# Patient Record
Sex: Female | Born: 1970 | Race: Black or African American | Hispanic: No | Marital: Married | Smoking: Never smoker
Health system: Southern US, Community
[De-identification: ages and names within clinical notes are randomized; demographics above are authoritative.]

## PROBLEM LIST (undated history)

## (undated) DIAGNOSIS — D649 Anemia, unspecified: Secondary | ICD-10-CM

## (undated) DIAGNOSIS — M76899 Other specified enthesopathies of unspecified lower limb, excluding foot: Secondary | ICD-10-CM

## (undated) DIAGNOSIS — M25531 Pain in right wrist: Secondary | ICD-10-CM

## (undated) DIAGNOSIS — G935 Compression of brain: Secondary | ICD-10-CM

## (undated) DIAGNOSIS — M519 Unspecified thoracic, thoracolumbar and lumbosacral intervertebral disc disorder: Secondary | ICD-10-CM

## (undated) DIAGNOSIS — M961 Postlaminectomy syndrome, not elsewhere classified: Secondary | ICD-10-CM

## (undated) DIAGNOSIS — R000 Tachycardia, unspecified: Secondary | ICD-10-CM

## (undated) DIAGNOSIS — G894 Chronic pain syndrome: Secondary | ICD-10-CM

## (undated) DIAGNOSIS — M5106 Intervertebral disc disorders with myelopathy, lumbar region: Secondary | ICD-10-CM

## (undated) DIAGNOSIS — Z8639 Personal history of other endocrine, nutritional and metabolic disease: Secondary | ICD-10-CM

## (undated) DIAGNOSIS — M25569 Pain in unspecified knee: Secondary | ICD-10-CM

## (undated) DIAGNOSIS — R011 Cardiac murmur, unspecified: Secondary | ICD-10-CM

## (undated) HISTORY — DX: Chronic pain syndrome: G89.4

## (undated) HISTORY — DX: Anemia, unspecified: D64.9

## (undated) HISTORY — DX: Tachycardia, unspecified: R00.0

## (undated) HISTORY — PX: CHOLECYSTECTOMY: SHX55

## (undated) HISTORY — DX: Intervertebral disc disorders with myelopathy, lumbar region: M51.06

## (undated) HISTORY — DX: Unspecified thoracic, thoracolumbar and lumbosacral intervertebral disc disorder: M51.9

## (undated) HISTORY — PX: BARIATRIC SURGERY: SHX1103

## (undated) HISTORY — DX: Pain in unspecified knee: M25.569

## (undated) HISTORY — DX: Other specified enthesopathies of unspecified lower limb, excluding foot: M76.899

## (undated) HISTORY — DX: Cardiac murmur, unspecified: R01.1

## (undated) HISTORY — DX: Compression of brain: G93.5

## (undated) HISTORY — DX: Pain in right wrist: M25.531

## (undated) HISTORY — DX: Postlaminectomy syndrome, not elsewhere classified: M96.1

---

## 2006-04-06 ENCOUNTER — Ambulatory Visit: Payer: Self-pay | Admitting: Internal Medicine

## 2007-06-23 ENCOUNTER — Encounter: Payer: Self-pay | Admitting: Internal Medicine

## 2007-06-23 DIAGNOSIS — J309 Allergic rhinitis, unspecified: Secondary | ICD-10-CM | POA: Insufficient documentation

## 2007-06-23 DIAGNOSIS — D649 Anemia, unspecified: Secondary | ICD-10-CM

## 2009-07-21 HISTORY — PX: BARIATRIC SURGERY: SHX1103

## 2010-12-06 NOTE — Assessment & Plan Note (Signed)
[facility]                             PRIMARY CARE OFFICE NOTE   NAME:WILLIAMSRishita, Petron                       MRN:          [id number]  DATE:04/06/2006                            DOB:          1971-01-23    CHIEF COMPLAINT:  New patient in practice/physical.   HISTORY OF PRESENT ILLNESS:  The patient is a 39 year old African-American  female who is here to establish primary care.  She has recently moved from  the [place] area.  The patient has been fairly healthy for most of her  life and has a history of mild iron deficiency anemia approximately 10 years  ago.  She has had no major illnesses or hospitalizations.  She had natural  childbirth January 28, 2006.  The patient was, however, noted to have some  issues with gestational diabetes.   The patient also has a strong family history with father deceased at age 45,  secondary to complications of diabetes and chronic renal failure; and mother  at age 80 is currently living and is also noted to be diabetic.   CURRENT MEDICATIONS:  None.   ALLERGIES TO MEDICATIONS:  SULFA which causes rash and fever.   SOCIAL HISTORY:  The patient is married, has an 40-month-old son and is a  Programmer, applications.   FAMILY HISTORY:  As noted above.  No family history of colon cancer or  breast cancer.  Some coronary artery disease on her father's side of the  family.   HABITS:  She seldom drinks.  Has no history of tobacco use or recreational  drug use.   PREVENTATIVE CARE HISTORY:  Her last Pap was in 2006.  She is currently in  the process of looking for a OB/GYN group.   PAST MEDICAL HISTORY SUMMARY:  A history of back pain with herniated disk  between L5-S1.   REVIEW OF SYSTEMS:  No fever or chills.  History of allergic rhinitis  currently quiescent.  The patient denies any chest pain, shortness of  breath, dyspnea on exertion.  Denies any heartburn, nausea, vomiting,  constipation, diarrhea,  no polyuria, polydipsia, and all other systems  negative.   PHYSICAL EXAMINATION:  VITALS:  Height 5 feet 5-1/2 inches.  Weight 223  pounds.  Temperature is 98.2.  Pulse is 92.  Blood pressure is 125/80.  GENERAL:  The patient is a very pleasant, obese, 40 year old [place]-  American female, pleasant in no apparent distress who appears her stated  age.  HEENT:  Normocephalic, atraumatic.  Pupils are equal and react to light  bilaterally.  Extraocular motility was intact.  Patient was anicteric.  Conjunctiva was within normal limits.  External auditory canal and tympanic  membranes are clear bilaterally.  Oropharyngeal exam was unremarkable.  NECK:  Supple.  No adenopathy, carotid bruit, or thyromegaly.  CHEST EXAM:  Normal expiratory effort.  Clear to auscultation bilaterally.  No rhonchi, rales or wheezing.  CARDIOVASCULAR:  Regular rate and rhythm.  No significant murmurs, rubs, or  gallops appreciated.  ABDOMEN:  Soft and protuberant, nontender.  Positive bowel sounds.  No  organomegaly.  MUSCULOSKELETAL EXAM:  No clubbing, cyanosis, or edema.  SKIN:  Warm and dry.  NEUROLOGIC:  Cranial nerves II-XII were grossly intact.  She was nonfocal.   IMPRESSION/RECOMMENDATIONS:  A routine physical of a 40 year old African-  American female with a strong family history of type 2 diabetes and elevated  blood sugars during pregnancy.   RECOMMENDATIONS:  The patient will have records from her previous physician  forwarded to [place].  Recommend a periodic check of her fasting blood sugar to  screen for type 2 diabetes.  Patient counseled on need for weight loss and  regular exercise.   We will refer the patient to local OB/GYN for Pap and pelvic.  She had a PPD  placed today as part of her job requirement, and will followup with me in 6-  8 months.                                   Barbette Hair. Artist Pais, DO   RDY/MedQ  DD:  04/06/2006  DT:  04/07/2006  Job #:  [id number]

## 2012-07-21 HISTORY — PX: CHOLECYSTECTOMY: SHX55

## 2015-12-13 ENCOUNTER — Other Ambulatory Visit: Payer: Self-pay | Admitting: Orthopedic Surgery

## 2015-12-13 DIAGNOSIS — M5441 Lumbago with sciatica, right side: Principal | ICD-10-CM

## 2015-12-13 DIAGNOSIS — G8929 Other chronic pain: Secondary | ICD-10-CM

## 2015-12-24 MED ORDER — CEFAZOLIN SODIUM-DEXTROSE 2-4 GM/100ML-% IV SOLN
2.0000 g | Freq: Once | INTRAVENOUS | Status: AC
  Administered 2015-12-25: 2 g via INTRAVENOUS

## 2015-12-24 MED ORDER — KETOROLAC TROMETHAMINE 30 MG/ML IJ SOLN
30.0000 mg | Freq: Once | INTRAMUSCULAR | Status: AC
  Administered 2015-12-25: 30 mg via INTRAVENOUS

## 2015-12-24 MED ORDER — SODIUM CHLORIDE 0.9 % IV SOLN
INTRAVENOUS | Status: DC
  Administered 2015-12-25: 08:00:00 via INTRAVENOUS

## 2015-12-24 MED ORDER — MIDAZOLAM HCL 2 MG/2ML IJ SOLN
1.0000 mg | INTRAMUSCULAR | Status: DC | PRN
  Administered 2015-12-25: 1 mg via INTRAVENOUS

## 2015-12-24 MED ORDER — FENTANYL CITRATE (PF) 100 MCG/2ML IJ SOLN
25.0000 ug | INTRAMUSCULAR | Status: DC | PRN
  Administered 2015-12-25: 25 ug via INTRAVENOUS
  Administered 2015-12-25: 50 ug via INTRAVENOUS

## 2015-12-25 ENCOUNTER — Ambulatory Visit
Admission: RE | Admit: 2015-12-25 | Discharge: 2015-12-25 | Disposition: A | Discharge status: Home / Self Care | Payer: Worker's Compensation | Source: Ambulatory Visit | Attending: Orthopedic Surgery | Admitting: Orthopedic Surgery

## 2015-12-25 ENCOUNTER — Other Ambulatory Visit: Payer: Self-pay | Admitting: Orthopedic Surgery

## 2015-12-25 ENCOUNTER — Inpatient Hospital Stay
Admission: RE | Admit: 2015-12-25 | Discharge: 2015-12-25 | Disposition: A | Discharge status: Home / Self Care | Payer: Self-pay | Source: Ambulatory Visit | Attending: Orthopedic Surgery | Admitting: Orthopedic Surgery

## 2015-12-25 VITALS — BP 128/79 | HR 85 | Temp 98.1°F | Resp 14 | Ht 65.0 in | Wt 135.0 lb

## 2015-12-25 DIAGNOSIS — M545 Low back pain: Secondary | ICD-10-CM

## 2015-12-25 DIAGNOSIS — G8929 Other chronic pain: Secondary | ICD-10-CM

## 2015-12-25 DIAGNOSIS — M5441 Lumbago with sciatica, right side: Principal | ICD-10-CM

## 2015-12-25 MED ORDER — IOPAMIDOL (ISOVUE-M 200) INJECTION 41%
8.0000 mL | Freq: Once | INTRAMUSCULAR | Status: AC
  Administered 2015-12-25: 8 mL

## 2015-12-25 NOTE — Progress Notes (Signed)
Sedation time for discogram is 37 minutes.  Donell SievertJeanne Danel Studzinski, RN

## 2015-12-25 NOTE — Discharge Instructions (Signed)
Discogram Post Procedure Discharge Instructions ° °1. May resume a regular diet and any medications that you routinely take (including pain medications). °2. No driving day of procedure. °3. Upon discharge go home and rest for at least 4 hours.  May use an ice pack as needed to injection sites on back.  Ice to back 30 minutes on and 30 minutes off, all day. °4. May remove bandades later, today. °5. It is not unusual to be sore for several days after this procedure. ° ° ° °Please contact our office at [phone] for the following symptoms: ° °· Fever greater than 100 degrees °· Increased swelling, pain, or redness at injection site. ° ° °Thank you for visiting [facility]. ° ° °

## 2016-01-29 ENCOUNTER — Ambulatory Visit: Payer: Self-pay | Admitting: Physician Assistant

## 2016-01-30 ENCOUNTER — Telehealth: Payer: Self-pay | Admitting: Vascular Surgery

## 2016-01-30 NOTE — Telephone Encounter (Signed)
-----   Message from Phillips Odorarol S Pullins, RN sent at 01/28/2016  5:41 PM EDT ----- Regarding: needs office consult with Dr. Vickey Hugerickson Contact: [phone]: [phone] Please schedule this pt. For a new pt. Consult with Dr. Edilia Boickson, prior to TDR L5-S1 scheduled on 03/06/16.  Please remind the pt. To bring her LS spine films to appt.

## 2016-01-30 NOTE — Telephone Encounter (Signed)
Sched appt 8/9 at 11. Lm on hm#.

## 2016-02-01 ENCOUNTER — Ambulatory Visit: Payer: Self-pay | Admitting: Physician Assistant

## 2016-02-07 ENCOUNTER — Other Ambulatory Visit: Payer: Self-pay

## 2016-02-08 ENCOUNTER — Other Ambulatory Visit: Payer: Self-pay

## 2016-02-11 ENCOUNTER — Telehealth: Payer: Self-pay | Admitting: Surgery

## 2016-02-11 NOTE — Telephone Encounter (Signed)
-----   Message from Phillips Odor, RN sent at 02/08/2016  2:06 PM EDT ----- Regarding: change new patient consult to Dr. Myra Gianotti Due to change in surgery date and MD assisting, we need to switch this pt. From 8/9 CSD appt. to one with Dr. Myra Gianotti.  The surgery has been rescheduled to 03/13/16.  Can we get her in to meet Dr. Myra Gianotti prior to 8/24?

## 2016-02-11 NOTE — Telephone Encounter (Signed)
Sched appt 8/21 at 10:00. Lm on hm# to inform pt.

## 2016-02-26 ENCOUNTER — Inpatient Hospital Stay (HOSPITAL_COMMUNITY)
Admission: RE | Admit: 2016-02-26 | Discharge: 2016-02-26 | Disposition: A | Discharge status: Home / Self Care | Payer: Self-pay | Source: Ambulatory Visit

## 2016-02-27 ENCOUNTER — Encounter: Payer: Self-pay | Admitting: Vascular Surgery

## 2016-02-29 ENCOUNTER — Encounter (HOSPITAL_COMMUNITY): Payer: Self-pay

## 2016-02-29 ENCOUNTER — Encounter (HOSPITAL_COMMUNITY)
Admission: RE | Admit: 2016-02-29 | Discharge: 2016-02-29 | Disposition: A | Discharge status: Home / Self Care | Payer: Worker's Compensation | Source: Ambulatory Visit | Attending: Orthopedic Surgery | Admitting: Orthopedic Surgery

## 2016-02-29 DIAGNOSIS — Z01818 Encounter for other preprocedural examination: Secondary | ICD-10-CM | POA: Diagnosis present

## 2016-02-29 DIAGNOSIS — D509 Iron deficiency anemia, unspecified: Secondary | ICD-10-CM | POA: Insufficient documentation

## 2016-02-29 DIAGNOSIS — Z79899 Other long term (current) drug therapy: Secondary | ICD-10-CM | POA: Insufficient documentation

## 2016-02-29 DIAGNOSIS — Z01812 Encounter for preprocedural laboratory examination: Secondary | ICD-10-CM | POA: Insufficient documentation

## 2016-02-29 DIAGNOSIS — Z9884 Bariatric surgery status: Secondary | ICD-10-CM | POA: Diagnosis not present

## 2016-02-29 HISTORY — DX: Personal history of other endocrine, nutritional and metabolic disease: Z86.39

## 2016-02-29 LAB — BASIC METABOLIC PANEL
ANION GAP: 8 (ref 5–15)
BUN: 9 mg/dL (ref 6–20)
CALCIUM: 9 mg/dL (ref 8.9–10.3)
CO2: 22 mmol/L (ref 22–32)
Chloride: 107 mmol/L (ref 101–111)
Creatinine, Ser: 0.69 mg/dL (ref 0.44–1.00)
GFR calc Af Amer: >60 mL/min (ref >60)
GFR calc non Af Amer: >60 mL/min (ref >60)
GLUCOSE: 79 mg/dL (ref 65–99)
POTASSIUM: 4 mmol/L (ref 3.5–5.1)
Sodium: 137 mmol/L (ref 135–145)

## 2016-02-29 LAB — HCG, SERUM, QUALITATIVE: Preg, Serum: NEGATIVE

## 2016-02-29 LAB — CBC
HEMATOCRIT: 24.3 % — AB (ref 36.0–46.0)
Hemoglobin: 6.7 g/dL — CL (ref 12.0–15.0)
MCH: 18.3 pg — AB (ref 26.0–34.0)
MCHC: 27.6 g/dL — ABNORMAL LOW (ref 30.0–36.0)
MCV: 66.2 fL — AB (ref 78.0–100.0)
Platelets: 288 10*3/uL (ref 150–400)
RBC: 3.67 MIL/uL — ABNORMAL LOW (ref 3.87–5.11)
RDW: 19.7 % — AB (ref 11.5–15.5)
WBC: 4.8 10*3/uL (ref 4.0–10.5)

## 2016-02-29 LAB — SURGICAL PCR SCREEN
MRSA, PCR: NEGATIVE
STAPHYLOCOCCUS AUREUS: NEGATIVE

## 2016-02-29 NOTE — Pre-Procedure Instructions (Signed)
Karla Duran  02/29/2016      [id number] - [place], [place] - [address] AT [facility] [address] [place] [address] Phone: [phone] Fax: [phone]    Your procedure is scheduled on Thursday, August 24th, 2017.  Report to [facility] Admitting at 5:30 A.M.   Call this number if you have problems the morning of surgery:  [phone]   Remember:  Do not eat food or drink liquids after midnight.   Take these medicines the morning of surgery with A SIP OF WATER: Nasal spray if needed.    7 days prior to surgery, stop taking: Aspirin, NSAIDS, Aleve, Naproxen, Ibuprofen, Advil, Motrin, BC's, Goody's, Fish oil, all herbal medications, and all vitamins.    Do not wear jewelry, make-up or nail polish.  Do not wear lotions, powders, or perfumes.  You may NOT wear deoderant.  Do not shave 48 hours prior to surgery.    Do not bring valuables to the hospital.  [facility] is not responsible for any belongings or valuables.  Contacts, dentures or bridgework may not be worn into surgery.  Leave your suitcase in the car.  After surgery it may be brought to your room.  For patients admitted to the hospital, discharge time will be determined by your treatment team.  Patients discharged the day of surgery will not be allowed to drive home.   Special instructions:  Preparing for Surgery.   Please read over the following fact sheets that you were given. MRSA Information    [place]- Preparing For Surgery  Before surgery, you can play an important role. Because skin is not sterile, your skin needs to be as free of germs as possible. You can reduce the number of germs on your skin by washing with CHG (chlorahexidine gluconate) Soap before surgery.  CHG is an antiseptic cleaner which kills germs and bonds with the skin to continue killing germs even after washing.  Please do not use if you have an allergy to  CHG or antibacterial soaps. If your skin becomes reddened/irritated stop using the CHG.  Do not shave (including legs and underarms) for at least 48 hours prior to first CHG shower. It is OK to shave your face.  Please follow these instructions carefully.   1. Shower the NIGHT BEFORE SURGERY and the MORNING OF SURGERY with CHG.   2. If you chose to wash your hair, wash your hair first as usual with your normal shampoo.  3. After you shampoo, rinse your hair and body thoroughly to remove the shampoo.  4. Use CHG as you would any other liquid soap. You can apply CHG directly to the skin and wash gently with a scrungie or a clean washcloth.   5. Apply the CHG Soap to your body ONLY FROM THE NECK DOWN.  Do not use on open wounds or open sores. Avoid contact with your eyes, ears, mouth and genitals (private parts). Wash genitals (private parts) with your normal soap.  6. Wash thoroughly, paying special attention to the area where your surgery will be performed.  7. Thoroughly rinse your body with warm water from the neck down.  8. DO NOT shower/wash with your normal soap after using and rinsing off the CHG Soap.  9. Pat yourself dry with a CLEAN TOWEL.   10. Wear CLEAN PAJAMAS   11. Place CLEAN SHEETS on your bed the night of your  first shower and DO NOT SLEEP WITH PETS.  Day of Surgery: Do not apply any deodorants/lotions. Please wear clean clothes to the hospital/surgery center.

## 2016-02-29 NOTE — Progress Notes (Signed)
PCP - [facility] if needed Cardiologist - denies  EKG/CXR - denies Echo/Stress test/Cardiac Cath - denies  Patient denies chest pain and shortness of breath at PAT appointment.

## 2016-02-29 NOTE — Progress Notes (Signed)
CRITICAL VALUE ALERT  Critical value received:  HgB 6.7  Date of notification:  02/29/2016  Time of notification:  1619  Critical value read back:Yes.    Nurse who received alert:  Cyndia DiverLindsi Jamaris Theard, RN  MD notified (1st page):  Dr. Shon BatonBrooks' office - Madlyn FrankelSherry Willis  Time of first page:  [phone]  Cordelia PenSherry states that she is paging Dr. Shon BatonBrooks to let him know of results and that she will contact the patient.

## 2016-03-04 ENCOUNTER — Encounter: Payer: Self-pay | Admitting: Surgery

## 2016-03-04 NOTE — Progress Notes (Addendum)
Anesthesia Chart Review: Patient is a 45 year old female scheduled for total disc replacement L5-S1 (Dr. Shon BatonBrooks) with abdominal exposure (Dr. Edilia Boickson) on 03/13/16.  History includes iron deficiency anemia, cholecystectomy, bariatric surgery (laparoscopic Roux-en-Y gastric bypass) [facility]. Non-smoker.   She [facility].  Meds include ibuprofen, Afrin nasal spray, Zanaflex, tramadol.  BP 137/79   Pulse 100   Temp 37 C   Resp 20   Ht 5' 5.5" (1.664 m)   Wt 127 lb (57.6 kg)   LMP 02/05/2016 (Approximate)   SpO2 100%   BMI 20.81 kg/m   Preoperative labs showed a normal BMET but severe anemia with H/H 6.7/24.3, MCV 66.2. Serum pregnancy negative. LMP 02/05/16. Dr. Shon BatonBrooks' office notified of H/H 02/29/16 and staff attempted to reach patient on multiple occasions and also leaving voice messages. Just this morning Dr. Shon BatonBrooks was finally able to talk with patient to review her results. She reportedly has a family member in the hospital (I believe it is her husband), and admittedly has not been taking care as good of care of her self (ie, poor intake. She is also on ibuprofen, but unclear how much she is actually taking.).  She is going to seek medical evaluation with labs within the next 24 hours. Dr. Shon BatonBrooks would make a final decision regarding timing of surgery once he had repeat results and recommendations from her medical provider. Chart will be left for follow-up.  Velna Ochsllison Zelenak, PA-C [facility]/Anesthesiology Phone [phone]) [phone] 03/04/2016 12:51 PM  Addendum: Patient had labs redrawn at [facility] on 03/07/16. Results came back today showing an H/H 7.6/26.8, MCV 65.9. Dr. Shon BatonBrooks called and spoke with Dr. Knox RoyaltyEnrico Jones. He is going to see the patient, and based on his recommendations Dr. Shon BatonBrooks will consider whether or not to proceed as planned or postpone. I have updated anesthesiologist Dr. Aleene DavidsonE.  Fitzgerald. We will await Dr. Yetta BarreJones' recommendations, but if surgery on 03/13/16 is still desired may need to consider admission prior to surgery for transfusion.  Sherry at Dr. Shon BatonBrooks' office will call with an update once available.  Velna Ochsllison Zelenak, PA-C [facility]/Anesthesiology Phone [phone]) [phone] 03/10/2016 5:11 PM  Addendum: I spoke with Cordelia PenSherry on the morning on 03/11/16 about Dr. Jarrett AblesFitzgerald's recommendations. She called me back late yesterday. She reports that Dr. Shon BatonBrooks spoke with Dr. Yetta BarreJones again in the afternoon, and that Dr. Yetta BarreJones is clearing patient for surgery since she has known anemia and because cell saver is being used intra-operatively. To Sherry's understanding, Dr. Shon BatonBrooks was not planning to transfuse patient prior to her arrival for surgery. I discussed with anesthesiologist Dr. Chaney MallingHodierne. He recommended that 1) PRBC be made available to be given before or during surgery as indicated. (I ordered T&S and prepare 3 Units PRBC to be drawn on arrival. Defer transfusion order to surgeon and/or anesthesiologist.) 2) Notify patient that she will likely require a blood transfusion and discuss consent. I called patient this morning, and informed her that it is likely that she will require a blood transfusion perioperatively. I did discuss the [facility] informed consent for transfusion of blood products form with patient. She is agreeable to a transfusion if needed. In regards to how her anemia is currently be managed, she reports that she saw Dr. Yetta BarreJones on 03/07/16 and 03/10/16 (office note requested). She is now taking iron and a multi-vitamin. Her husband is now home from the hospital, and  she has been taking better care of herself. (Reportedly, she had not eaten for three days when she came in to PAT last week due to the stress of her husband being hospitalized and wanting to be by his side.) Her LMP was 03/07/16 and was not excessively heavy.  Further evaluation by her surgeon and  anesthesiologist on arrival tomorrow. She will get STAT T&S, Prepare PRBC on arrival. Defer decision for transfusion to surgeon or anesthesiologist. I have notified today's Holding charge RN Boneta LucksJenny in hopes staff will get lab draw expedited on patient's arrival tomorrow.   Velna Ochsllison Zelenak, PA-C [facility]/Anesthesiology Phone [phone]) [phone] 03/12/2016 11:03 AM

## 2016-03-10 ENCOUNTER — Encounter: Payer: Self-pay | Admitting: Surgery

## 2016-03-10 ENCOUNTER — Ambulatory Visit (INDEPENDENT_AMBULATORY_CARE_PROVIDER_SITE_OTHER): Payer: Worker's Compensation | Admitting: Surgery

## 2016-03-10 VITALS — BP 122/80 | HR 123 | Temp 98.6°F | Resp 16 | Ht 65.0 in | Wt 128.0 lb

## 2016-03-10 DIAGNOSIS — M479 Spondylosis, unspecified: Secondary | ICD-10-CM

## 2016-03-10 NOTE — Progress Notes (Signed)
[facility]  Patient name: Karla CanterShawn P Saunders-Williams MRN: [id number] DOB: 1970/12/06 Sex: female  REFERRING PHYSICIAN: Dr. Shon BatonBrooks  REASON FOR CONSULT: Anterior exposure  HPI: Karla CanterShawn P Saunders-Williams is a 45 y.o. female, who is referred today for discussions regarding anterior exposure of the L5-S1 disc space.  The patient has been seen and evaluated by Dr. Shon BatonBrooks who has recommended disc replacement of this level.  Patient suffers from chronic back pain.  She has a history of gastric bypass done laparoscopically as well as cholecystectomy.  She is a nonsmoker.    Past Medical History:  Diagnosis Date  . History of iron deficiency     No family history on file.  SOCIAL HISTORY: Social History   Social History  . Marital status: Married    Spouse name: N/A  . Number of children: N/A  . Years of education: N/A   Occupational History  . Not on file.   Social History Main Topics  . Smoking status: Never Smoker  . Smokeless tobacco: Never Used  . Alcohol use 0.0 oz/week     Comment: occ.   . Drug use: No  . Sexual activity: Not on file   Other Topics Concern  . Not on file   Social History Narrative  . No narrative on file    Allergies  Allergen Reactions  . Sulfa Antibiotics Rash and Other (See Comments)    fever    Current Outpatient Prescriptions  Medication Sig Dispense Refill  . tiZANidine (ZANAFLEX) 4 MG tablet Take 4 mg by mouth at bedtime as needed for muscle spasms.   0  . traMADol (ULTRAM) 50 MG tablet Take 50 mg by mouth at bedtime as needed (pain).   0  . ibuprofen (ADVIL,MOTRIN) 200 MG tablet Take 600-800 mg by mouth 2 (two) times daily as needed for headache (pain).    Marland Kitchen. oxymetazoline (AFRIN) 0.05 % nasal spray Place 1 spray into both nostrils at bedtime as needed for congestion.     No current facility-administered medications for this visit.     REVIEW OF SYSTEMS:  [X]   denotes positive finding, [ ]  denotes negative finding Cardiac  Comments:  Chest pain or chest pressure:    Shortness of breath upon exertion:    Short of breath when lying flat:    Irregular heart rhythm:        Vascular    Pain in calf, thigh, or hip brought on by ambulation:    Pain in feet at night that wakes you up from your sleep:     Blood clot in your veins:    Leg swelling:         Pulmonary    Oxygen at home:    Productive cough:     Wheezing:         Neurologic    Sudden weakness in arms or legs:     Sudden numbness in arms or legs:     Sudden onset of difficulty speaking or slurred speech:    Temporary loss of vision in one eye:     Problems with dizziness:         Gastrointestinal    Blood in stool:     Vomited blood:         Genitourinary    Burning when urinating:     Blood in urine:        Psychiatric    Major depression:  Hematologic    Bleeding problems:    Problems with blood clotting too easily:        Skin    Rashes or ulcers:        Constitutional    Fever or chills:      PHYSICAL EXAM: Vitals:   03/10/16 1011  BP: 122/80  Pulse: (!) 123  Resp: 16  Temp: 98.6 F (37 C)  TempSrc: Oral  SpO2: 100%  Weight: 128 lb (58.1 kg)  Height: 5\' 5"  (1.651 m)    GENERAL: The patient is a well-nourished female, in no acute distress. The vital signs are documented above. CARDIAC: There is a regular rate and rhythm.  VASCULAR: Palpable pedal pulses PULMONARY: There is good air exchange bilaterally without wheezing or rales. ABDOMEN: Soft and non-tender with normal pitched bowel sounds.  MUSCULOSKELETAL: There are no major deformities or cyanosis. NEUROLOGIC: No focal weakness or paresthesias are detected. SKIN: There are no ulcers or rashes noted. PSYCHIATRIC: The patient has a normal affect.  DATA:  I have reviewed her CT scan.  There is no significant atherosclerotic vascular disease  MEDICAL ISSUES: Degenerative back disease: I  discussed my role in the anterior exposure of the L5-S1 disc space.  We discussed potential injury to the iliac artery and vein as well as the ureter.  We discussed postoperative ileus and wound complications.  All of her questions were answered.  Her surgery is scheduled for this Thursday, August 24   Durene CalWells Meagen Limones, MD [facility] Tel [phone]) [phone] Pager [phone](336) [phone]

## 2016-03-12 NOTE — Anesthesia Preprocedure Evaluation (Signed)
Anesthesia Evaluation  Patient identified by MRN, date of birth, ID band Patient awake    Reviewed: Allergy & Precautions, NPO status , Patient's Chart, lab work & pertinent test results  Airway Mallampati: II  TM Distance: >3 FB Neck ROM: Full    Dental no notable dental hx.    Pulmonary neg pulmonary ROS,    Pulmonary exam normal breath sounds clear to auscultation       Cardiovascular negative cardio ROS Normal cardiovascular exam Rhythm:Regular Rate:Normal     Neuro/Psych negative neurological ROS  negative psych ROS   GI/Hepatic negative GI ROS, Neg liver ROS,   Endo/Other  negative endocrine ROS  Renal/GU negative Renal ROS     Musculoskeletal negative musculoskeletal ROS (+)   Abdominal   Peds  Hematology  (+) anemia ,   Anesthesia Other Findings   Reproductive/Obstetrics negative OB ROS                            Anesthesia Physical  Anesthesia Plan  ASA: II  Anesthesia Plan: General   Post-op Pain Management:    Induction: Intravenous  Airway Management Planned: Oral ETT  Additional Equipment:   Intra-op Plan:   Post-operative Plan: Extubation in OR  Informed Consent: I have reviewed the patients History and Physical, chart, labs and discussed the procedure including the risks, benefits and alternatives for the proposed anesthesia with the patient or authorized representative who has indicated his/her understanding and acceptance.   Dental advisory given  Plan Discussed with: CRNA  Anesthesia Plan Comments:        Anesthesia Quick Evaluation  

## 2016-03-13 ENCOUNTER — Inpatient Hospital Stay (HOSPITAL_COMMUNITY)
Admission: RE | Admit: 2016-03-13 | Discharge: 2016-03-14 | DRG: 460 | Disposition: A | Discharge status: Home / Self Care | Payer: Worker's Compensation | Source: Ambulatory Visit | Attending: Orthopedic Surgery | Admitting: Orthopedic Surgery

## 2016-03-13 ENCOUNTER — Inpatient Hospital Stay (HOSPITAL_COMMUNITY): Payer: Worker's Compensation

## 2016-03-13 ENCOUNTER — Encounter (HOSPITAL_COMMUNITY): Payer: Self-pay | Admitting: Certified Registered Nurse Anesthetist

## 2016-03-13 ENCOUNTER — Inpatient Hospital Stay (HOSPITAL_COMMUNITY): Payer: Worker's Compensation | Admitting: Vascular Surgery

## 2016-03-13 ENCOUNTER — Inpatient Hospital Stay (HOSPITAL_COMMUNITY): Payer: Worker's Compensation | Admitting: Emergency Medicine

## 2016-03-13 ENCOUNTER — Encounter (HOSPITAL_COMMUNITY)
Admission: RE | Disposition: A | Discharge status: Home / Self Care | Payer: Self-pay | Source: Ambulatory Visit | Attending: Orthopedic Surgery

## 2016-03-13 DIAGNOSIS — M5410 Radiculopathy, site unspecified: Secondary | ICD-10-CM | POA: Diagnosis present

## 2016-03-13 DIAGNOSIS — Z981 Arthrodesis status: Secondary | ICD-10-CM

## 2016-03-13 DIAGNOSIS — Z9884 Bariatric surgery status: Secondary | ICD-10-CM | POA: Diagnosis not present

## 2016-03-13 DIAGNOSIS — Z419 Encounter for procedure for purposes other than remedying health state, unspecified: Secondary | ICD-10-CM

## 2016-03-13 DIAGNOSIS — M5137 Other intervertebral disc degeneration, lumbosacral region: Secondary | ICD-10-CM

## 2016-03-13 DIAGNOSIS — M5116 Intervertebral disc disorders with radiculopathy, lumbar region: Principal | ICD-10-CM | POA: Diagnosis present

## 2016-03-13 DIAGNOSIS — Z79899 Other long term (current) drug therapy: Secondary | ICD-10-CM

## 2016-03-13 DIAGNOSIS — M549 Dorsalgia, unspecified: Secondary | ICD-10-CM | POA: Diagnosis present

## 2016-03-13 HISTORY — PX: ABDOMINAL EXPOSURE: SHX5708

## 2016-03-13 HISTORY — PX: ANTERIOR LUMBAR DISC ARTHROPLASTY: SHX5721

## 2016-03-13 LAB — PREPARE RBC (CROSSMATCH)

## 2016-03-13 LAB — ABO/RH: ABO/RH(D): O POS

## 2016-03-13 SURGERY — ANTERIOR LUMBAR DISC ARTHROPLASTY
Anesthesia: General | Site: Back

## 2016-03-13 MED ORDER — THROMBIN 20000 UNITS EX KIT: PACK | CUTANEOUS | Status: DC | PRN

## 2016-03-13 MED ORDER — ARTIFICIAL TEARS OP OINT
TOPICAL_OINTMENT | OPHTHALMIC | Status: AC
  Filled 2016-03-13: qty 3.5

## 2016-03-13 MED ORDER — SUGAMMADEX SODIUM 200 MG/2ML IV SOLN
INTRAVENOUS | Status: AC
  Filled 2016-03-13: qty 2

## 2016-03-13 MED ORDER — CEFAZOLIN SODIUM-DEXTROSE 2-4 GM/100ML-% IV SOLN
2.0000 g | INTRAVENOUS | Status: AC
  Administered 2016-03-13: 2 g via INTRAVENOUS
  Filled 2016-03-13: qty 100

## 2016-03-13 MED ORDER — OXYCODONE-ACETAMINOPHEN 10-325 MG PO TABS: 1.0000 | ORAL_TABLET | ORAL | 0 refills | Status: DC | PRN

## 2016-03-13 MED ORDER — SUCCINYLCHOLINE CHLORIDE 200 MG/10ML IV SOSY
PREFILLED_SYRINGE | INTRAVENOUS | Status: AC
  Filled 2016-03-13: qty 10

## 2016-03-13 MED ORDER — CHLORHEXIDINE GLUCONATE 4 % EX LIQD: 60.0000 mL | Freq: Once | CUTANEOUS | Status: DC

## 2016-03-13 MED ORDER — PROPOFOL 10 MG/ML IV BOLUS
INTRAVENOUS | Status: DC | PRN
  Administered 2016-03-13: 120 mg via INTRAVENOUS
  Administered 2016-03-13: 80 mg via INTRAVENOUS

## 2016-03-13 MED ORDER — MENTHOL 3 MG MT LOZG: 1.0000 | LOZENGE | OROMUCOSAL | Status: DC | PRN

## 2016-03-13 MED ORDER — PROPOFOL 10 MG/ML IV BOLUS
INTRAVENOUS | Status: AC
  Filled 2016-03-13: qty 20

## 2016-03-13 MED ORDER — MIDAZOLAM HCL 2 MG/2ML IJ SOLN
INTRAMUSCULAR | Status: AC
  Filled 2016-03-13: qty 2

## 2016-03-13 MED ORDER — THROMBIN 20000 UNITS EX SOLR
CUTANEOUS | Status: AC
  Filled 2016-03-13: qty 20000

## 2016-03-13 MED ORDER — PHENOL 1.4 % MT LIQD
1.0000 | OROMUCOSAL | Status: DC | PRN
Start: 2016-03-13 — End: 2016-03-14

## 2016-03-13 MED ORDER — HYDROMORPHONE HCL 1 MG/ML IJ SOLN
INTRAMUSCULAR | Status: AC
  Administered 2016-03-13: 0.5 mg via INTRAVENOUS
  Filled 2016-03-13: qty 1

## 2016-03-13 MED ORDER — DEXAMETHASONE SODIUM PHOSPHATE 10 MG/ML IJ SOLN
INTRAMUSCULAR | Status: DC | PRN
  Administered 2016-03-13: 10 mg via INTRAVENOUS

## 2016-03-13 MED ORDER — 0.9 % SODIUM CHLORIDE (POUR BTL) OPTIME
TOPICAL | Status: DC | PRN
  Administered 2016-03-13 (×2): 1000 mL

## 2016-03-13 MED ORDER — PHENYLEPHRINE HCL 10 MG/ML IJ SOLN
INTRAMUSCULAR | Status: DC | PRN
  Administered 2016-03-13: 120 ug via INTRAVENOUS
  Administered 2016-03-13: 160 ug via INTRAVENOUS
  Administered 2016-03-13: 120 ug via INTRAVENOUS

## 2016-03-13 MED ORDER — LIDOCAINE 2% (20 MG/ML) 5 ML SYRINGE
INTRAMUSCULAR | Status: AC
  Filled 2016-03-13: qty 5

## 2016-03-13 MED ORDER — DEXAMETHASONE 4 MG PO TABS
4.0000 mg | ORAL_TABLET | Freq: Four times a day (QID) | ORAL | Status: AC
  Administered 2016-03-13 (×3): 4 mg via ORAL
  Filled 2016-03-13 (×3): qty 1

## 2016-03-13 MED ORDER — METHOCARBAMOL 500 MG PO TABS
500.0000 mg | ORAL_TABLET | Freq: Four times a day (QID) | ORAL | Status: DC | PRN
  Administered 2016-03-13 – 2016-03-14 (×3): 500 mg via ORAL
  Filled 2016-03-13 (×3): qty 1

## 2016-03-13 MED ORDER — METOPROLOL TARTARATE 1 MG/ML SYRINGE (5ML)
Status: DC | PRN
  Administered 2016-03-13 (×4): 1 mg via INTRAVENOUS

## 2016-03-13 MED ORDER — ENOXAPARIN SODIUM 40 MG/0.4ML ~~LOC~~ SOLN
40.0000 mg | SUBCUTANEOUS | Status: DC
  Administered 2016-03-14: 40 mg via SUBCUTANEOUS
  Filled 2016-03-13: qty 0.4

## 2016-03-13 MED ORDER — THROMBIN 20000 UNITS EX SOLR
CUTANEOUS | Status: DC | PRN
  Administered 2016-03-13: 20 mL via TOPICAL

## 2016-03-13 MED ORDER — SUCCINYLCHOLINE CHLORIDE 20 MG/ML IJ SOLN
INTRAMUSCULAR | Status: DC | PRN
  Administered 2016-03-13: 80 mg via INTRAVENOUS

## 2016-03-13 MED ORDER — MEPERIDINE HCL 25 MG/ML IJ SOLN: 6.2500 mg | INTRAMUSCULAR | Status: DC | PRN

## 2016-03-13 MED ORDER — METHOCARBAMOL 1000 MG/10ML IJ SOLN
500.0000 mg | Freq: Four times a day (QID) | INTRAVENOUS | Status: DC | PRN
  Filled 2016-03-13: qty 5

## 2016-03-13 MED ORDER — BUPIVACAINE-EPINEPHRINE (PF) 0.25% -1:200000 IJ SOLN
INTRAMUSCULAR | Status: AC
  Filled 2016-03-13: qty 30

## 2016-03-13 MED ORDER — HYDROMORPHONE HCL 1 MG/ML IJ SOLN
0.2500 mg | INTRAMUSCULAR | Status: DC | PRN
  Administered 2016-03-13 (×4): 0.5 mg via INTRAVENOUS

## 2016-03-13 MED ORDER — GLYCOPYRROLATE 0.2 MG/ML IJ SOLN
INTRAMUSCULAR | Status: DC | PRN
  Administered 2016-03-13: 0.2 mg via INTRAVENOUS

## 2016-03-13 MED ORDER — OXYCODONE HCL 5 MG PO TABS
10.0000 mg | ORAL_TABLET | ORAL | Status: DC | PRN
  Administered 2016-03-13 – 2016-03-14 (×4): 10 mg via ORAL
  Filled 2016-03-13 (×4): qty 2

## 2016-03-13 MED ORDER — LACTATED RINGERS IV SOLN
INTRAVENOUS | Status: DC | PRN
  Administered 2016-03-13 (×3): via INTRAVENOUS

## 2016-03-13 MED ORDER — MIDAZOLAM HCL 2 MG/2ML IJ SOLN
INTRAMUSCULAR | Status: DC | PRN
  Administered 2016-03-13 (×2): 1 mg via INTRAVENOUS

## 2016-03-13 MED ORDER — BUPIVACAINE-EPINEPHRINE 0.25% -1:200000 IJ SOLN
INTRAMUSCULAR | Status: DC | PRN
  Administered 2016-03-13: 10 mL

## 2016-03-13 MED ORDER — ONDANSETRON HCL 4 MG/2ML IJ SOLN
INTRAMUSCULAR | Status: AC
  Filled 2016-03-13: qty 2

## 2016-03-13 MED ORDER — PHENYLEPHRINE HCL 10 MG/ML IJ SOLN
INTRAMUSCULAR | Status: DC | PRN
  Administered 2016-03-13: 50 ug/min via INTRAVENOUS

## 2016-03-13 MED ORDER — ONDANSETRON HCL 4 MG/2ML IJ SOLN
INTRAMUSCULAR | Status: DC | PRN
  Administered 2016-03-13: 4 mg via INTRAVENOUS

## 2016-03-13 MED ORDER — PROPOFOL 500 MG/50ML IV EMUL
INTRAVENOUS | Status: DC | PRN
  Administered 2016-03-13: 25 ug/kg/min via INTRAVENOUS

## 2016-03-13 MED ORDER — ENOXAPARIN SODIUM 40 MG/0.4ML ~~LOC~~ SOLN: 40.0000 mg | SUBCUTANEOUS | 0 refills | Status: DC

## 2016-03-13 MED ORDER — ACETAMINOPHEN 10 MG/ML IV SOLN
INTRAVENOUS | Status: DC | PRN
  Administered 2016-03-13: 1000 mg via INTRAVENOUS

## 2016-03-13 MED ORDER — METOPROLOL TARTRATE 5 MG/5ML IV SOLN
INTRAVENOUS | Status: AC
  Filled 2016-03-13: qty 5

## 2016-03-13 MED ORDER — LACTATED RINGERS IV SOLN: INTRAVENOUS | Status: DC

## 2016-03-13 MED ORDER — ONDANSETRON HCL 4 MG PO TABS: 4.0000 mg | ORAL_TABLET | Freq: Three times a day (TID) | ORAL | 0 refills | Status: DC | PRN

## 2016-03-13 MED ORDER — LIDOCAINE HCL (CARDIAC) 20 MG/ML IV SOLN
INTRAVENOUS | Status: DC | PRN
  Administered 2016-03-13: 100 mg via INTRATRACHEAL

## 2016-03-13 MED ORDER — GLYCOPYRROLATE 0.2 MG/ML IV SOSY
PREFILLED_SYRINGE | INTRAVENOUS | Status: AC
  Filled 2016-03-13: qty 3

## 2016-03-13 MED ORDER — MORPHINE SULFATE (PF) 2 MG/ML IV SOLN
1.0000 mg | INTRAVENOUS | Status: DC | PRN
  Administered 2016-03-13 (×2): 4 mg via INTRAVENOUS
  Filled 2016-03-13 (×3): qty 2

## 2016-03-13 MED ORDER — CEFAZOLIN IN D5W 1 GM/50ML IV SOLN
1.0000 g | Freq: Three times a day (TID) | INTRAVENOUS | Status: AC
  Administered 2016-03-13 (×2): 1 g via INTRAVENOUS
  Filled 2016-03-13 (×2): qty 50

## 2016-03-13 MED ORDER — FENTANYL CITRATE (PF) 100 MCG/2ML IJ SOLN
INTRAMUSCULAR | Status: AC
  Filled 2016-03-13: qty 6

## 2016-03-13 MED ORDER — SODIUM CHLORIDE 0.9% FLUSH: 3.0000 mL | INTRAVENOUS | Status: DC | PRN

## 2016-03-13 MED ORDER — ACETAMINOPHEN 10 MG/ML IV SOLN
INTRAVENOUS | Status: AC
  Filled 2016-03-13: qty 100

## 2016-03-13 MED ORDER — PROMETHAZINE HCL 25 MG/ML IJ SOLN: 6.2500 mg | INTRAMUSCULAR | Status: DC | PRN

## 2016-03-13 MED ORDER — DEXAMETHASONE SODIUM PHOSPHATE 4 MG/ML IJ SOLN: 4.0000 mg | Freq: Four times a day (QID) | INTRAMUSCULAR | Status: AC

## 2016-03-13 MED ORDER — PROPOFOL 1000 MG/100ML IV EMUL
INTRAVENOUS | Status: AC
  Filled 2016-03-13: qty 200

## 2016-03-13 MED ORDER — LUBIPROSTONE 24 MCG PO CAPS
24.0000 ug | ORAL_CAPSULE | Freq: Two times a day (BID) | ORAL | Status: DC
  Administered 2016-03-13 – 2016-03-14 (×2): 24 ug via ORAL
  Filled 2016-03-13 (×3): qty 1

## 2016-03-13 MED ORDER — ROCURONIUM BROMIDE 100 MG/10ML IV SOLN
INTRAVENOUS | Status: DC | PRN
  Administered 2016-03-13: 50 mg via INTRAVENOUS

## 2016-03-13 MED ORDER — SODIUM CHLORIDE 0.9% FLUSH
3.0000 mL | Freq: Two times a day (BID) | INTRAVENOUS | Status: DC
  Administered 2016-03-13: 3 mL via INTRAVENOUS

## 2016-03-13 MED ORDER — ONDANSETRON HCL 4 MG/2ML IJ SOLN: 4.0000 mg | INTRAMUSCULAR | Status: DC | PRN

## 2016-03-13 MED ORDER — ROCURONIUM BROMIDE 10 MG/ML (PF) SYRINGE
PREFILLED_SYRINGE | INTRAVENOUS | Status: AC
  Filled 2016-03-13: qty 10

## 2016-03-13 MED ORDER — DEXAMETHASONE SODIUM PHOSPHATE 10 MG/ML IJ SOLN
INTRAMUSCULAR | Status: AC
  Filled 2016-03-13: qty 1

## 2016-03-13 MED ORDER — FENTANYL CITRATE (PF) 100 MCG/2ML IJ SOLN
INTRAMUSCULAR | Status: DC | PRN
  Administered 2016-03-13: 100 ug via INTRAVENOUS
  Administered 2016-03-13 (×2): 50 ug via INTRAVENOUS
  Administered 2016-03-13: 100 ug via INTRAVENOUS

## 2016-03-13 MED ORDER — SODIUM CHLORIDE 0.9 % IV SOLN: 250.0000 mL | INTRAVENOUS | Status: DC

## 2016-03-13 MED ORDER — METHOCARBAMOL 500 MG PO TABS: 500.0000 mg | ORAL_TABLET | Freq: Three times a day (TID) | ORAL | 0 refills | Status: DC | PRN

## 2016-03-13 MED ORDER — HEMOSTATIC AGENTS (NO CHARGE) OPTIME
TOPICAL | Status: DC | PRN
  Administered 2016-03-13: 1 via TOPICAL

## 2016-03-13 MED ORDER — MAGNESIUM CITRATE PO SOLN
1.0000 | Freq: Once | ORAL | Status: AC
  Administered 2016-03-13: 1 via ORAL
  Filled 2016-03-13: qty 296

## 2016-03-13 SURGICAL SUPPLY — 100 items
ADH SKN CLS APL DERMABOND .7 (GAUZE/BANDAGES/DRESSINGS) ×2
AGENT HMST MTR 8 SURGIFLO (HEMOSTASIS) ×2
APPLIER CLIP 11 MED OPEN (CLIP)
APR CLP MED 11 20 MLT OPN (CLIP)
BLADE SURG 10 STRL SS (BLADE) ×4 IMPLANT
CANISTER SUCT 3000ML PPV (MISCELLANEOUS) ×2 IMPLANT
CANISTER SUCTION 1500CC (MISCELLANEOUS) ×2 IMPLANT
CLIP APPLIE 11 MED OPEN (CLIP) ×2 IMPLANT
CLOSURE STERI-STRIP 1/2X4 (GAUZE/BANDAGES/DRESSINGS) ×1
CLOSURE WOUND 1/2 X4 (GAUZE/BANDAGES/DRESSINGS) ×1
CLSR STERI-STRIP ANTIMIC 1/2X4 (GAUZE/BANDAGES/DRESSINGS) ×1 IMPLANT
CORDS BIPOLAR (ELECTRODE) ×4 IMPLANT
COVER SURGICAL LIGHT HANDLE (MISCELLANEOUS) ×8 IMPLANT
DECANTER SPIKE VIAL GLASS SM (MISCELLANEOUS) ×4 IMPLANT
DERMABOND ADVANCED (GAUZE/BANDAGES/DRESSINGS) ×2
DERMABOND ADVANCED .7 DNX12 (GAUZE/BANDAGES/DRESSINGS) ×2 IMPLANT
DRAPE C-ARM 42X72 X-RAY (DRAPES) ×6 IMPLANT
DRAPE C-ARMOR (DRAPES) ×2 IMPLANT
DRAPE INCISE IOBAN 66X45 STRL (DRAPES) ×4 IMPLANT
DRAPE POUCH INSTRU U-SHP 10X18 (DRAPES) ×4 IMPLANT
DRAPE PROXIMA HALF (DRAPES) ×14 IMPLANT
DRAPE SURG 17X23 STRL (DRAPES) ×10 IMPLANT
DRAPE U-SHAPE 47X51 STRL (DRAPES) ×4 IMPLANT
DRSG AQUACEL AG ADV 3.5X 6 (GAUZE/BANDAGES/DRESSINGS) ×2 IMPLANT
DRSG MEPILEX BORDER 4X8 (GAUZE/BANDAGES/DRESSINGS) ×4 IMPLANT
DURAPREP 26ML APPLICATOR (WOUND CARE) ×4 IMPLANT
ELECT BLADE 4.0 EZ CLEAN MEGAD (MISCELLANEOUS) ×4
ELECT PENCIL ROCKER SW 15FT (MISCELLANEOUS) ×4 IMPLANT
ELECT REM PT RETURN 9FT ADLT (ELECTROSURGICAL) ×4
ELECTRODE BLDE 4.0 EZ CLN MEGD (MISCELLANEOUS) ×2 IMPLANT
ELECTRODE REM PT RTRN 9FT ADLT (ELECTROSURGICAL) ×2 IMPLANT
GAUZE SPONGE 4X4 16PLY XRAY LF (GAUZE/BANDAGES/DRESSINGS) ×6 IMPLANT
GLOVE BIO SURGEON STRL SZ 6.5 (GLOVE) ×4 IMPLANT
GLOVE BIO SURGEONS STRL SZ 6.5 (GLOVE) ×2
GLOVE BIOGEL PI IND STRL 6.5 (GLOVE) ×2 IMPLANT
GLOVE BIOGEL PI IND STRL 7.0 (GLOVE) IMPLANT
GLOVE BIOGEL PI IND STRL 7.5 (GLOVE) ×2 IMPLANT
GLOVE BIOGEL PI IND STRL 8.5 (GLOVE) ×2 IMPLANT
GLOVE BIOGEL PI INDICATOR 6.5 (GLOVE) ×4
GLOVE BIOGEL PI INDICATOR 7.0 (GLOVE) ×4
GLOVE BIOGEL PI INDICATOR 7.5 (GLOVE) ×4
GLOVE BIOGEL PI INDICATOR 8.5 (GLOVE) ×2
GLOVE SS BIOGEL STRL SZ 8.5 (GLOVE) ×2 IMPLANT
GLOVE SUPERSENSE BIOGEL SZ 8.5 (GLOVE) ×2
GLOVE SURG SS PI 6.0 STRL IVOR (GLOVE) ×2 IMPLANT
GLOVE SURG SS PI 7.5 STRL IVOR (GLOVE) ×4 IMPLANT
GOWN STRL REUS W/ TWL LRG LVL3 (GOWN DISPOSABLE) ×6 IMPLANT
GOWN STRL REUS W/ TWL XL LVL3 (GOWN DISPOSABLE) ×2 IMPLANT
GOWN STRL REUS W/TWL 2XL LVL3 (GOWN DISPOSABLE) ×8 IMPLANT
GOWN STRL REUS W/TWL LRG LVL3 (GOWN DISPOSABLE) ×12
GOWN STRL REUS W/TWL XL LVL3 (GOWN DISPOSABLE) ×4
HEMOSTAT SNOW SURGICEL 2X4 (HEMOSTASIS) IMPLANT
INLAY POLYETHYLENE W/TANTALUM (Orthopedic Implant) ×2 IMPLANT
KIT BASIN OR (CUSTOM PROCEDURE TRAY) ×4 IMPLANT
KIT ROOM TURNOVER OR (KITS) ×8 IMPLANT
LIQUID BAND (GAUZE/BANDAGES/DRESSINGS) ×4 IMPLANT
LOOP VESSEL MINI RED (MISCELLANEOUS) ×4 IMPLANT
NDL 18GX1X1/2 (RX/OR ONLY) (NEEDLE) ×2 IMPLANT
NEEDLE 18GX1X1/2 (RX/OR ONLY) (NEEDLE) ×4 IMPLANT
NS IRRIG 1000ML POUR BTL (IV SOLUTION) ×4 IMPLANT
PACK LAMINECTOMY ORTHO (CUSTOM PROCEDURE TRAY) ×4 IMPLANT
PACK UNIVERSAL I (CUSTOM PROCEDURE TRAY) ×4 IMPLANT
PAD ARMBOARD 7.5X6 YLW CONV (MISCELLANEOUS) ×16 IMPLANT
PLATE END SUPERIOR (Plate) ×2 IMPLANT
PLATE INFERIOR END (Plate) ×2 IMPLANT
SPOGE SURGIFLO 8M (HEMOSTASIS) ×2
SPONGE INTESTINAL PEANUT (DISPOSABLE) ×8 IMPLANT
SPONGE LAP 18X18 X RAY DECT (DISPOSABLE) IMPLANT
SPONGE LAP 4X18 X RAY DECT (DISPOSABLE) IMPLANT
SPONGE SURGIFLO 8M (HEMOSTASIS) IMPLANT
SPONGE SURGIFOAM ABS GEL 100 (HEMOSTASIS) ×4 IMPLANT
STRIP CLOSURE SKIN 1/2X4 (GAUZE/BANDAGES/DRESSINGS) ×3 IMPLANT
SURGIFLO W/THROMBIN 8M KIT (HEMOSTASIS) IMPLANT
SUT BONE WAX W31G (SUTURE) ×4 IMPLANT
SUT MNCRL AB 4-0 PS2 18 (SUTURE) ×4 IMPLANT
SUT MON AB 3-0 SH 27 (SUTURE) ×4
SUT MON AB 3-0 SH27 (SUTURE) ×2 IMPLANT
SUT PDS AB 1 CTX 36 (SUTURE) ×4 IMPLANT
SUT PROLENE 4 0 RB 1 (SUTURE)
SUT PROLENE 4-0 RB1 .5 CRCL 36 (SUTURE) ×8 IMPLANT
SUT PROLENE 5 0 CC1 (SUTURE) IMPLANT
SUT PROLENE 6 0 C 1 30 (SUTURE) ×2 IMPLANT
SUT PROLENE 6 0 CC (SUTURE) IMPLANT
SUT SILK 2 0 TIES 10X30 (SUTURE) ×6 IMPLANT
SUT SILK 3 0 TIES 10X30 (SUTURE) ×4 IMPLANT
SUT SILK 3 0SH CR/8 30 (SUTURE) IMPLANT
SUT VIC AB 0 CT1 27 (SUTURE) ×12
SUT VIC AB 0 CT1 27XBRD ANBCTR (SUTURE) ×6 IMPLANT
SUT VIC AB 2-0 CT1 18 (SUTURE) ×4 IMPLANT
SUT VIC AB 2-0 CT1 27 (SUTURE) ×4
SUT VIC AB 2-0 CT1 TAPERPNT 27 (SUTURE) ×2 IMPLANT
SUT VIC AB 3-0 SH 27 (SUTURE) ×12
SUT VIC AB 3-0 SH 27X BRD (SUTURE) ×2 IMPLANT
SUT VIC AB 3-0 SH 27XBRD (SUTURE) IMPLANT
SYR BULB IRRIGATION 50ML (SYRINGE) ×4 IMPLANT
SYR CONTROL 10ML LL (SYRINGE) ×4 IMPLANT
TOWEL OR 17X24 6PK STRL BLUE (TOWEL DISPOSABLE) ×4 IMPLANT
TOWEL OR 17X26 10 PK STRL BLUE (TOWEL DISPOSABLE) ×4 IMPLANT
TRAY FOLEY CATH 16FR SILVER (SET/KITS/TRAYS/PACK) ×4 IMPLANT
WATER STERILE IRR 1000ML POUR (IV SOLUTION) ×2 IMPLANT

## 2016-03-13 NOTE — Interval H&P Note (Signed)
History and Physical Interval Note:  03/13/2016 7:19 AM  Karla Duran  has presented today for surgery, with the diagnosis of L5-S1 DISCOGENIC LUMBAR BACK PAIN   The various methods of treatment have been discussed with the patient and family. After consideration of risks, benefits and other options for treatment, the patient has consented to  Procedure(s): TOTAL DISC REPLACEMENT L5-S1 (N/A) ABDOMINAL EXPOSURE (N/A) as a surgical intervention .  The patient's history has been reviewed, patient examined, no change in status, stable for surgery.  I have reviewed the patient's chart and labs.  Questions were answered to the patient's satisfaction.     Durene CalBrabham, [place]

## 2016-03-13 NOTE — H&P (View-Only) (Signed)
 [facility] [place]  Patient name: Karla Duran MRN: [id number] DOB: 03/20/1971 Sex: female  REFERRING PHYSICIAN: Dr. Brooks  REASON FOR CONSULT: Anterior exposure  HPI: Karla Duran is a 45 y.o. female, who is referred today for discussions regarding anterior exposure of the L5-S1 disc space.  The patient has been seen and evaluated by Dr. Brooks who has recommended disc replacement of this level.  Patient suffers from chronic back pain.  She has a history of gastric bypass done laparoscopically as well as cholecystectomy.  She is a nonsmoker.    Past Medical History:  Diagnosis Date  . History of iron deficiency     No family history on file.  SOCIAL HISTORY: Social History   Social History  . Marital status: Married    Spouse name: N/A  . Number of children: N/A  . Years of education: N/A   Occupational History  . Not on file.   Social History Main Topics  . Smoking status: Never Smoker  . Smokeless tobacco: Never Used  . Alcohol use 0.0 oz/week     Comment: occ.   . Drug use: No  . Sexual activity: Not on file   Other Topics Concern  . Not on file   Social History Narrative  . No narrative on file    Allergies  Allergen Reactions  . Sulfa Antibiotics Rash and Other (See Comments)    fever    Current Outpatient Prescriptions  Medication Sig Dispense Refill  . tiZANidine (ZANAFLEX) 4 MG tablet Take 4 mg by mouth at bedtime as needed for muscle spasms.   0  . traMADol (ULTRAM) 50 MG tablet Take 50 mg by mouth at bedtime as needed (pain).   0  . ibuprofen (ADVIL,MOTRIN) 200 MG tablet Take 600-800 mg by mouth 2 (two) times daily as needed for headache (pain).    . oxymetazoline (AFRIN) 0.05 % nasal spray Place 1 spray into both nostrils at bedtime as needed for congestion.     No current facility-administered medications for this visit.     REVIEW OF SYSTEMS:  [X]  denotes positive finding, [ ] denotes negative finding Cardiac  Comments:  Chest pain or chest pressure:    Shortness of breath upon exertion:    Short of breath when lying flat:    Irregular heart rhythm:        Vascular    Pain in calf, thigh, or hip brought on by ambulation:    Pain in feet at night that wakes you up from your sleep:     Blood clot in your veins:    Leg swelling:         Pulmonary    Oxygen at home:    Productive cough:     Wheezing:         Neurologic    Sudden weakness in arms or legs:     Sudden numbness in arms or legs:     Sudden onset of difficulty speaking or slurred speech:    Temporary loss of vision in one eye:     Problems with dizziness:         Gastrointestinal    Blood in stool:     Vomited blood:         Genitourinary    Burning when urinating:     Blood in urine:        Psychiatric    Major depression:           Hematologic    Bleeding problems:    Problems with blood clotting too easily:        Skin    Rashes or ulcers:        Constitutional    Fever or chills:      PHYSICAL EXAM: Vitals:   03/10/16 1011  BP: 122/80  Pulse: (!) 123  Resp: 16  Temp: 98.6 F (37 C)  TempSrc: Oral  SpO2: 100%  Weight: 128 lb (58.1 kg)  Height: 5\' 5"  (1.651 m)    GENERAL: The patient is a well-nourished female, in no acute distress. The vital signs are documented above. CARDIAC: There is a regular rate and rhythm.  VASCULAR: Palpable pedal pulses PULMONARY: There is good air exchange bilaterally without wheezing or rales. ABDOMEN: Soft and non-tender with normal pitched bowel sounds.  MUSCULOSKELETAL: There are no major deformities or cyanosis. NEUROLOGIC: No focal weakness or paresthesias are detected. SKIN: There are no ulcers or rashes noted. PSYCHIATRIC: The patient has a normal affect.  DATA:  I have reviewed her CT scan.  There is no significant atherosclerotic vascular disease  MEDICAL ISSUES: Degenerative back disease: I  discussed my role in the anterior exposure of the L5-S1 disc space.  We discussed potential injury to the iliac artery and vein as well as the ureter.  We discussed postoperative ileus and wound complications.  All of her questions were answered.  Her surgery is scheduled for this Thursday, August 24   Durene CalWells Braidon Chermak, MD [facility] Tel [phone] Pager [phone]

## 2016-03-13 NOTE — Progress Notes (Signed)
VASCULAR LAB PRELIMINARY  PRELIMINARY  PRELIMINARY  PRELIMINARY  Bilateral lower extremity venous duplex completed.    Preliminary report:  Bilateral:  No evidence of DVT, superficial thrombosis, or Baker's Cyst.   Brookelle Pellicane, RVS 03/13/2016, 3:03 PM

## 2016-03-13 NOTE — Anesthesia Postprocedure Evaluation (Signed)
Anesthesia Post Note  Patient: Karla Duran  Procedure(s) Performed: Procedure(s) (LRB): TOTAL DISC REPLACEMENT L5-S1 (N/A) ABDOMINAL EXPOSURE (N/A)  Patient location during evaluation: PACU Anesthesia Type: General Level of consciousness: sedated and patient cooperative Pain management: pain level controlled Vital Signs Assessment: post-procedure vital signs reviewed and stable Respiratory status: spontaneous breathing Cardiovascular status: stable Anesthetic complications: no    Last Vitals:  Vitals:   03/13/16 1220 03/13/16 1240  BP: 115/77 127/78  Pulse: 80 77  Resp: 13 18  Temp: 36.3 C 36.7 C    Last Pain:  Vitals:   03/13/16 1245  TempSrc:   PainSc: 5                  Lewie LoronJohn Hendrick Pavich

## 2016-03-13 NOTE — H&P (Signed)
History of Present Illness The patient is a 45 year old female who comes in today for a preoperative History and Physical. The patient is scheduled for a TDR L5-S1 to be performed by Dr. Debria Garretahari D. Shon BatonBrooks, [facility] 03-13-16 . Please see the hospital record for complete dictated history and physical.   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)   Allergies  Sulfa Antibiotics  Allergies Reconciled   Family History  Kidney disease  father Diabetes Mellitus  mother and father  Social History  Tobacco use  never smoker Tobacco / smoke exposure  no Children  1 Current work status  working full time Number of flights of stairs before winded  4-5 Alcohol use  current drinker; drinks wine; only occasionally per week Drug/Alcohol Rehab (Currently)  no Illicit drug use  no Living situation  live with spouse Drug/Alcohol Rehab (Previously)  no Marital status  married Pain Contract  no Exercise  Exercises weekly; does running / walking and other  Medication History  Ibuprofen (200MG  Tablet, Oral) Active. TraMADol HCl (50MG  Tablet, 1 (one) Oral every 6-8 hours, as needed for pain, Taken starting 02/01/2016) Active. TiZANidine HCl (4MG  Capsule, Oral) Active. (prn) Medications Reconciled  Past Surgical History  Gallbladder Surgery  laporoscopic  Vitals  02/26/2016 7:51 AM Weight: 150 lb Height: 65in Body Surface Area: 1.75 m Body Mass Index: 24.96 kg/m  Temp.: 98.89F(Oral)  Pulse: 127 (Regular)  BP: 129/88 (Sitting, Left Arm, Standard)  General General Appearance-Not in acute  distress. Orientation-Oriented X3. Build & Nutrition-Well nourished and Well developed.  Integumentary General Characteristics Surgical Scars - no surgical scar evidence of previous lumbar surgery. Lumbar Spine-Skin examination of the lumbar spine is without deformity, skin lesions, lacerations or abrasions.  Chest and Lung Exam Auscultation Breath sounds - Normal and Clear.  Cardiovascular Auscultation Rhythm - Regular rate and rhythm.  Abdomen Palpation/Percussion Palpation and Percussion of the abdomen reveal - Soft, Non Tender and No Rebound tenderness.  Peripheral Vascular Lower Extremity Palpation - Posterior tibial pulse - Bilateral - 2+. Dorsalis pedis pulse - Bilateral - 2+.  Neurologic Sensation Lower Extremity - Bilateral - sensation is intact in the lower extremity. Reflexes Patellar Reflex - Bilateral - 2+. Achilles Reflex - Bilateral - 2+. Clonus - Bilateral - clonus not present. Hoffman's Sign - Bilateral - Hoffman's sign not present. Testing Seated Straight Leg Raise - Bilateral - Seated straight leg raise negative.  Musculoskeletal Spine/Ribs/Pelvis  Lumbosacral Spine: Inspection and Palpation - Tenderness - left lumbar paraspinals tender to palpation and right lumbar paraspinals tender to palpation. Strength and Tone: Strength - Hip Flexion - Bilateral - 5/5. Knee Extension - Bilateral - 5/5. Knee Flexion - Bilateral - 5/5. Ankle Dorsiflexion - Bilateral - 5/5. Ankle Plantarflexion - Bilateral - 5/5. Heel walk - Bilateral - able to heel walk without difficulty. Toe Walk - Bilateral - able to walk on toes without difficulty. Heel-Toe Walk - Bilateral - able to heel-toe walk without difficulty. ROM - Flexion - moderately decreased range of motion and painful. Extension - moderately decreased range of motion and painful. Left Lateral Bending - moderately decreased range of motion and painful. Right Lateral Bending - moderately decreased range of motion and  painful. Right Rotation -  moderately decreased range of motion and painful. Left Rotation - moderately decreased range of motion and painful. Pain - . Lumbosacral Spine - Waddell's Signs - no Waddell's signs present. Lower Extremity Range of Motion - No true hip, knee or ankle pain with range of motion. Gait and Station - [employer] - no assistive devices.  Her discogram from 12/25/2015 was re-reviewed. She has concordant pain at L5-S1 with reproduction of back, buttock, and even the right lower extremity pain at very relatively low pressures. She had disc concordant pain at L4-5 and a normal control level at L3-4.  At this point given the fact she has a well-maintained disc height, I do not think a fusion should be our best surgical option at this time. I do think that having failed conservative management and having concordant pain at L5-S1 that she would be a good candidate for a lumbar disc replacement. The motion preservation arthroplasty would allow for hopefully return to pre-injury level of function and improved quality of life. We reviewed the risks, benefits, and alternatives to surgery. These include infection, bleeding, nerve damage, death, stroke, paralysis, failure to heal, need for further surgery, ongoing or worst pain, loss of bowel and bladder control, technical inability to place the disc and need to do a fusion, ongoing or worst pain, blood clots as well. All of her questions were encouraged and addressed. We will plan on submitting prevention for the disc replacement and moving forward with surgery in the near future.

## 2016-03-13 NOTE — Op Note (Signed)
    Patient name: Karla CanterShawn P Saunders-Williams MRN: [id number] DOB: 02-01-1971 Sex: female  03/13/2016 Pre-operative Diagnosis: Degenerative Back Disease Post-operative diagnosis:  Same Surgeon:  Durene CalBrabham, Wells Co-Surgeon:  D. Shon BatonBrooks, M.D. Procedure:   Anterior exposure, L5-S1 Anesthesia:  Gen. Blood Loss:  See anesthesia record Specimens:  None  Findings:  Normal anatomy  Indications:  The patient was evaluated by Dr. Shon BatonBrooks felt that she was a good candidate for anterior disc its rotation of the L5-S1 disc space.  I was asked to provide anterior exposure.  Procedure:  The patient was identified in the holding area and [facility]  The patient was then placed supine on the table. general anesthesia was administered.  The patient was prepped and draped in the usual sterile fashion.  A time out was called and antibiotics were administered.  A C-arm was used to identify the appropriate level of the skin incision.  A transverse left lower quadrant incision was made below the umbilicus.  Cautery was used to divide the subcutaneous tissue to identify the anterior fascia.  This was opened with cautery.  Subfascial flaps were created.  The medial and lateral border of the rectus muscle was fully mobilized.  The retroperitoneal space was entered from lateral to the rectus muscle.  I identified the iliac artery and vein as well as the ureter and reflected them laterally.  I then switched to a approach from the medial side of the rectus muscle.  Wiley retractors were used to help with exposure.  Next, the [facility] retractor was set up.  I used a 100 reversed lip blades on the right lateral side of the spine.  I then used a 120 Y reversed lip blades on the left lateral side of the spine.  Malleable retractors were placed superiorly and inferiorly.  We had excellent exposure of the disc space which was confirmed with fluoroscopy.  At this point Dr. Shon BatonBrooks came in and performed his portion of the procedure  as well as closure of the incision.  Please see his detailed operative note.   Disposition:  To PACU in stable condition.   Juleen ChinaV. Wells Shraddha Lebron, M.D. [facility] [place] Office: [phone] Pager:  [phone]

## 2016-03-13 NOTE — Brief Op Note (Signed)
03/13/2016  10:28 AM  PATIENT:  Karla Duran  45 y.o. female  PRE-OPERATIVE DIAGNOSIS:  L5-S1 DISCOGENIC LUMBAR BACK PAIN   POST-OPERATIVE DIAGNOSIS:  L5-S1 DISCOGENIC LUMBAR BACK PAIN   PROCEDURE:  Procedure(s): TOTAL DISC REPLACEMENT L5-S1 (N/A) ABDOMINAL EXPOSURE (N/A)  SURGEON:  Surgeon(s) and Role: Panel 1:    * Venita Lickahari Costella Schwarz, MD - Primary  Panel 2:    * Nada LibmanVance W Brabham, MD - Primary (approach surgeon) PHYSICIAN ASSISTANT:   ASSISTANTS: Carmen Mayo   ANESTHESIA:   general  EBL:  Total I/O In: 2000 [I.V.:2000] Out: 300 [Urine:150; Blood:150]  BLOOD ADMINISTERED:none  DRAINS: none   LOCAL MEDICATIONS USED:  NONE  SPECIMEN:  No Specimen  DISPOSITION OF SPECIMEN:  N/A  COUNTS:  YES  TOURNIQUET:  * No tourniquets in log *  DICTATION: .Other Dictation: Dictation Number [id number]  PLAN OF CARE: Admit to inpatient   PATIENT DISPOSITION:  PACU - hemodynamically stable.

## 2016-03-13 NOTE — Transfer of Care (Signed)
Immediate Anesthesia Transfer of Care Note  Patient: Karla Duran  Procedure(s) Performed: Procedure(s): TOTAL DISC REPLACEMENT L5-S1 (N/A) ABDOMINAL EXPOSURE (N/A)  Patient Location: PACU  Anesthesia Type:General  Level of Consciousness: awake, alert , oriented and patient cooperative  Airway & Oxygen Therapy: Patient Spontanous Breathing and Patient connected to face mask oxygen  Post-op Assessment: Report given to RN, Post -op Vital signs reviewed and stable, Patient moving all extremities X 4 and Patient able to stick tongue midline  Post vital signs: Reviewed and stable  Last Vitals:  Vitals:   03/13/16 0630 03/13/16 1049  BP: 138/86 (P) 126/72  Pulse: 93   Resp: 16   Temp: 36.8 C     Last Pain:  Vitals:   03/13/16 0639  TempSrc:   PainSc: 5       Patients Stated Pain Goal: 3 (03/13/16 [id number])  Complications: No apparent anesthesia complications

## 2016-03-13 NOTE — Anesthesia Procedure Notes (Signed)
Procedure Name: Intubation Date/Time: 03/13/2016 8:38 AM Performed by: Lewie LoronGERMEROTH, JOHN Pre-anesthesia Checklist: Patient identified, Emergency Drugs available and Suction available Patient Re-evaluated:Patient Re-evaluated prior to inductionOxygen Delivery Method: Circle system utilized Preoxygenation: Pre-oxygenation with 100% oxygen Intubation Type: IV induction Ventilation: Mask ventilation without difficulty Laryngoscope Size: Glidescope (T3) Grade View: Grade I Tube type: Oral Number of attempts: 1 Airway Equipment and Method: Video-laryngoscopy Placement Confirmation: ETT inserted through vocal cords under direct vision,  positive ETCO2 and breath sounds checked- equal and bilateral Secured at: 24 cm Dental Injury: Teeth and Oropharynx as per pre-operative assessment  Difficulty Due To: Difficulty was anticipated

## 2016-03-14 ENCOUNTER — Encounter (HOSPITAL_COMMUNITY): Payer: Self-pay | Admitting: Orthopedic Surgery

## 2016-03-14 ENCOUNTER — Inpatient Hospital Stay (HOSPITAL_COMMUNITY): Payer: Worker's Compensation

## 2016-03-14 LAB — TYPE AND SCREEN
ABO/RH(D): O POS
ANTIBODY SCREEN: NEGATIVE
UNIT DIVISION: 0
UNIT DIVISION: 0
Unit division: 0

## 2016-03-14 NOTE — Evaluation (Signed)
Physical Therapy Evaluation/ Discharge Patient Details Name: Karla Duran MRN: [id number] DOB: [birth year] Today's Date: 03/14/2016   History of Present Illness  45 yo admitted for L5-S1 arthroplasty.  no significant PMHx  Clinical Impression  Pt very pleasant and moving well. Pt able to verbalize all precautions end of session and only required cues x 2 to prevent twisting with functional activity. Pt with limitations only by pain and precautions. Pt educated for transfers, gait, functional mobility, ADLs ,brace wear, activity modification and increase, all with handout provided. Pt will have necessary assist at home, all questions answered and no further needs at this time with pt aware and agreeable, will sign off.     Follow Up Recommendations No PT follow up    Equipment Recommendations  None recommended by PT    Recommendations for Other Services       Precautions / Restrictions Precautions Precautions: Back Precaution Booklet Issued: Yes (comment) Required Braces or Orthoses: Spinal Brace Spinal Brace: Lumbar corset;Applied in sitting position      Mobility  Bed Mobility Overal bed mobility: Modified Independent                Transfers Overall transfer level: Needs assistance   Transfers: Sit to/from Stand Sit to Stand: Supervision         General transfer comment: cues for posture, not to twist  Ambulation/Gait Ambulation/Gait assistance: Modified independent (Device/Increase time) Ambulation Distance (Feet): 300 Feet Assistive device: None Gait Pattern/deviations: WFL(Within Functional Limits)   Gait velocity interpretation: at or above normal speed for age/gender    Stairs Stairs: Yes Stairs assistance: Modified independent (Device/Increase time) Stair Management: No rails;Step to pattern;Forwards Number of Stairs: 2    Wheelchair Mobility    Modified Rankin (Stroke Patients Only)       Balance                                             Pertinent Vitals/Pain Pain Assessment: 0-10 Pain Score: 7  Pain Location: incision and abdomen Pain Descriptors / Indicators: Sore Pain Intervention(s): Limited activity within patient's tolerance;Premedicated before session;Repositioned    Home Living Family/patient expects to be discharged to:: Private residence Living Arrangements: Spouse/significant other;Children Available Help at Discharge: Family;Available 24 hours/day Type of Home: House Home Access: Stairs to enter Entrance Stairs-Rails: None Entrance Stairs-Number of Steps: 2 Home Layout: One level Home Equipment: None      Prior Function Level of Independence: Independent               Hand Dominance        Extremity/Trunk Assessment   Upper Extremity Assessment: Overall WFL for tasks assessed           Lower Extremity Assessment: Overall WFL for tasks assessed      Cervical / Trunk Assessment: Normal  Communication   Communication: No difficulties  Cognition Arousal/Alertness: Awake/alert Behavior During Therapy: WFL for tasks assessed/performed Overall Cognitive Status: Within Functional Limits for tasks assessed                      General Comments      Exercises        Assessment/Plan    PT Assessment Patent does not need any further PT services  PT Diagnosis Acute pain   PT Problem List    PT Treatment Interventions  PT Goals (Current goals can be found in the Care Plan section) Acute Rehab PT Goals PT Goal Formulation: All assessment and education complete, DC therapy    Frequency     Barriers to discharge        Co-evaluation               End of Session Equipment Utilized During Treatment: Back brace Activity Tolerance: Patient tolerated treatment well Patient left: in bed;with call bell/phone within reach Nurse Communication: Mobility status;Precautions         Time: [id number] PT Time Calculation (min)  (ACUTE ONLY): 17 min   Charges:   PT Evaluation $PT Eval Moderate Complexity: 1 Procedure     PT G CodesDelorse Lek:        Tabor, Emmer Lillibridge Beth 03/14/2016, 9:21 AM Delaney MeigsMaija Tabor Jenevie Casstevens, PT [phone]

## 2016-03-14 NOTE — Progress Notes (Signed)
OT Screen Note  Patient Details Name: Karla CanterShawn P Duran MRN: [id number] DOB: Dec 09, 1970   Cancelled Treatment:    Reason Eval/Treat Not Completed: OT screened, no needs identified, will sign off. OT spoke directly to patient regarding questions and concerns. Pt educated on transfer into Engineer, petroleumford explorer. Pt without further questions at this time.   Felecia ShellingJones, Ramani Riva B   Bobbye Petti, Brynn   OTR/L Pager: [phone] Office: [phone] .  03/14/2016, 9:00 AM

## 2016-03-14 NOTE — Op Note (Signed)
NAMAris Lot:  SAUNDERS-WILLIAMS, Tomika     ACCOUNT NO.:  [id number]  MEDICAL RECORD NO.:  [id number]  LOCATION:  3C02C                        FACILITY:  [facility]  PHYSICIAN:  Dru Primeau D. Shon BatonBrooks, M.D. DATE OF BIRTH:  19-Jun-1971  DATE OF PROCEDURE:  03/13/2016 DATE OF DISCHARGE:                              OPERATIVE REPORT   PREOPERATIVE DIAGNOSIS:  Discogenic low back pain, L5-S1.  POSTOPERATIVE DIAGNOSIS:  Discogenic low back pain, L5-S1.  OPERATIVE PROCEDURE:  Total disk arthroplasty, L5-S1.  IMPLANT USED:  DePuy PRODISC lumbar, size 10, 11-degree lordotic medium implant.  COMPLICATIONS:  None.  APPROACHED SURGEON: 1. Charlena CrossWells Brabham IV, MD  FIRST ASSISTANAnette Riedel:  Carmen Mayo.  HISTORY:  This is a very pleasant, young woman who has had progressive debilitating back pain for sometime now.  Attempts at conservative management consisting of injection therapy, physical therapy, activity modification, and narcotic medications have failed to alleviate her symptoms.  Preoperative diskogram confirmed concordant pain at L5-S1. At this point in time, because of the ongoing debilitating pain and the failure to improve, we elected to proceed with surgery.  All appropriate risks, benefits, and alternatives were discussed with the patient and consent was obtained.  OPERATIVE NOTE:  The patient was brought to the operating room and placed supine on the operating table.  After successful induction of general anesthesia and endotracheal intubation, TEDs, SCDs, and a Foley were inserted and the abdomen was prepped and draped in a standard fashion.  Time-out was taken to confirm patient, procedure, and all other pertinent important data.  Once this was completed, myself and Dr. Myra GianottiBrabham scrubbed in, and he performed a standard retroperitoneal approach to the spine.  Please refer to his dictation for specifics on the approach.  [facility] the [facility] were set, Dr. Myra GianottiBrabham scrubbed out and I  continued on with the surgery.  My assistant [facility] Mayo then scrubbed in at this time.  We placed a needle into the L5-S1 disk space and took an x-ray to confirm that we were at the appropriate level. Once we confirmed that we were at the appropriate level, an annulotomy was performed with a 10-blade scalpel.  I then used a Cobb elevator to dissect the disk material off the endplates and then used pituitary rongeurs to remove the bulk of the disk material.  I then used curettes to scrape the remaining cartilage off the endplate and remove the posterior aspect of the disk using pituitary rongeurs assisted in the disk removal.  I then used a small curved curette to release the annulus from the posterior portion of the vertebral bodies of L5 and S1 to allow for parallel distraction.  I then removed the posterior osteophytes from the vertebral bodies of 5 and S1 using a 2 and through 3 mm Kerrison punch.  Once I had an adequate diskectomy performed, I then trialed the endplate.  I confirmed the midline in the AP fluoro view and confirmed depth and position in the lateral view.  I then used the osteotome to create my fin cut and then once this was done, I irrigated the wound copiously with normal saline and obtained the implant.  The implant was malleted to the appropriate depth and confirmed positioning in the AP and lateral planes.  Once this was done, I then obtained hemostasis using bipolar electrocautery and FloSeal.  Once I had hemostasis, I irrigated the wound copiously with normal saline and used bone wax to address any residual bone bleeders.  I then removed the retracting blades of the Thompson retractor sequentially and confirmed there was no bleeding.  I then closed the rectus sheath with a #1 Stratafix suture, and then I did a layer of 0 Vicryl and then 2-0 Vicryl and 3-0 Monocryl for the skin.  Steri-Strips and dry dressing were applied and the patient was ultimately extubated,  transferred to the PACU without incident.  At the end of the case, all needle and sponge counts were correct.  There were no adverse intraoperative events.  Also, we took final x-ray, AP x-ray, which were satisfactory and there was just surgical implanting device was seen on the x-ray.  No other equipment was noted in the abdomen.     Early Ord D. Shon Baton, M.D.     DDB/MEDQ  D:  03/13/2016  T:  03/14/2016  Job:  [id number]  cc:   Jorge Ny, MD

## 2016-03-14 NOTE — H&P (Signed)
    Subjective: Procedure(s) (LRB): TOTAL DISC REPLACEMENT L5-S1 (N/A) ABDOMINAL EXPOSURE (N/A) 1 Day Post-Op  Patient reports pain as 1 on 0-10 scale and 2 on 0-10 scale.  Reports decreased leg pain reports incisional back pain   Positive void Negative bowel movement Positive flatus Negative chest pain or shortness of breath  Objective: Vital signs in last 24 hours: Temp:  [97.3 F (36.3 C)-99.5 F (37.5 C)] 98.9 F (37.2 C) (08/25 0400) Pulse Rate:  [77-96] 89 (08/25 0400) Resp:  [13-18] 18 (08/25 0400) BP: (113-127)/(67-78) 117/72 (08/25 0400) SpO2:  [95 %-100 %] 100 % (08/25 0400)  Intake/Output from previous day: 08/24 0701 - 08/25 0700 In: 3890 [P.O.:840; I.V.:3000; IV Piggyback:50] Out: 1650 [Urine:1500; Blood:150]  Labs: No results for input(s): WBC, RBC, HCT, PLT in the last 72 hours. No results for input(s): NA, K, CL, CO2, BUN, CREATININE, GLUCOSE, CALCIUM in the last 72 hours. No results for input(s): LABPT, INR in the last 72 hours.  Physical Exam: Neurologically intact ABD soft Intact pulses distally Incision: dressing C/D/I Compartment soft  Assessment/Plan: Patient stable  xrays satisfactory Continue mobilization with physical therapy Continue care  Advance diet Up with therapy  Doing well LE Doppler - negative Positive flatus - minimal pain Plan on d/c this afternoon  Venita Lickahari Georgette Helmer, MD [facility] [phone]

## 2016-03-14 NOTE — Progress Notes (Signed)
Pt doing well. Pt and family given D/C instructions with Rx's, verbal understanding was provided. Pt's incision is clean and dry with no sign of infection. Pt's IV was removed prior to D/C. Pt D/C'd home via wheelchair @ 1300 per MD order. Pt is stable @ D/C and has no other needs at this time. Rema FendtAshley Kashvi Prevette, RN

## 2016-03-26 NOTE — Discharge Summary (Signed)
Patient ID: Karla Duran MRN: [id number] DOB/AGE: 1970-11-19 45 y.o.  Admit date: 03/13/2016 Discharge date: 03/26/2016  Admission Diagnoses:  Active Problems:   Back pain   Discharge Diagnoses:  Active Problems:   Back pain  status post Procedure(s): TOTAL DISC REPLACEMENT L5-S1 ABDOMINAL EXPOSURE  Past Medical History:  Diagnosis Date  . History of iron deficiency     Surgeries: Procedure(s): TOTAL DISC REPLACEMENT L5-S1 ABDOMINAL EXPOSURE on 03/13/2016   Consultants: Treatment Team:  Nada LibmanVance W Brabham, MD  Discharged Condition: Improved  Hospital Course: Karla Duran is an 45 y.o. female who was admitted 03/13/2016 for operative treatment of  Discogenic LBP L5-S1. Patient failed conservative treatments (please see the history and physical for the specifics) and had severe unremitting pain that affects sleep, daily activities and work/hobbies. After pre-op clearance, the patient was taken to the operating room on 03/13/2016 and underwent  Procedure(s): TOTAL DISC REPLACEMENT L5-S1 ABDOMINAL EXPOSURE.  Pt reports decreased radicular leg pain.  Incisional pain well controlled on oral medication.  Pt urinating w/o difficulty.  Pt ambulating.  Pt cleared by PT for DC.  Patient was given perioperative antibiotics:  Anti-infectives    Start     Dose/Rate Route Frequency Ordered Stop   03/13/16 1530  ceFAZolin (ANCEF) IVPB 1 g/50 mL premix     1 g 100 mL/hr over 30 Minutes Intravenous Every 8 hours 03/13/16 1245 03/13/16 2355   03/13/16 0617  ceFAZolin (ANCEF) IVPB 2g/100 mL premix     2 g 200 mL/hr over 30 Minutes Intravenous 30 min pre-op 03/13/16 0617 03/13/16 0750       Patient was given sequential compression devices and early ambulation to prevent DVT.   Patient benefited maximally from hospital stay and there were no complications. At the time of discharge, the patient was urinating/moving their bowels without difficulty, tolerating a  regular diet, pain is controlled with oral pain medications and they have been cleared by PT/OT.   Recent vital signs: No data found.    Recent laboratory studies: No results for input(s): WBC, HGB, HCT, PLT, NA, K, CL, CO2, BUN, CREATININE, GLUCOSE, INR, CALCIUM in the last 72 hours.  Invalid input(s): PT, 2   Discharge Medications:     Medication List    STOP taking these medications   ibuprofen 200 MG tablet Commonly known as:  ADVIL,MOTRIN   tiZANidine 4 MG tablet Commonly known as:  ZANAFLEX   traMADol 50 MG tablet Commonly known as:  ULTRAM     TAKE these medications   enoxaparin 40 MG/0.4ML injection Commonly known as:  LOVENOX Inject 0.4 mLs (40 mg total) into the skin daily. 10 day supply 1 injection per day   methocarbamol 500 MG tablet Commonly known as:  ROBAXIN Take 1 tablet (500 mg total) by mouth 3 (three) times daily as needed for muscle spasms.   ondansetron 4 MG tablet Commonly known as:  ZOFRAN Take 1 tablet (4 mg total) by mouth every 8 (eight) hours as needed for nausea or vomiting.   oxyCODONE-acetaminophen 10-325 MG tablet Commonly known as:  PERCOCET Take 1 tablet by mouth every 4 (four) hours as needed for pain.   oxymetazoline 0.05 % nasal spray Commonly known as:  AFRIN Place 1 spray into both nostrils at bedtime as needed for congestion.       Diagnostic Studies: Dg Lumbar Spine 2-3 Views  Result Date: 03/14/2016 CLINICAL DATA:  Lumbar fusion. EXAM: LUMBAR SPINE - 2-3 VIEW COMPARISON:  03/13/2016.  FINDINGS: Mild scoliosis concave left. L5-S1 interbody fusion. Good anatomic alignment. No acute bony abnormality. Surgical clips right upper quadrant. Pelvic calcifications consistent phleboliths. Mild small and large bowel distention suggesting adynamic ileus. IMPRESSION: L5-S1 interbody fusion with good anatomic alignment. No acute abnormality . 2. Mild small large bowel distention suggesting adynamic ileus . Electronically Signed   By:  Maisie Fus  Register   On: 03/14/2016 07:24   Dg Lumbar Spine 2-3 Views  Result Date: 03/13/2016 CLINICAL DATA:  Intraoperative localization for L5-S1 disc replacement EXAM: DG C-ARM 61-120 MIN; LUMBAR SPINE - 2-3 VIEW COMPARISON:  None. FLUOROSCOPY TIME:  Radiation Exposure Index (as provided by the fluoroscopic device): Not available If the device does not provide the exposure index: Fluoroscopy Time:  1 minutes 26 seconds Number of Acquired Images:  3 FINDINGS: The initial image demonstrates an anterior access with surgical retractors in place and a needle extending into the L5-S1 disc space. Subsequent images show disc replacement at L5-S1 in satisfactory position. IMPRESSION: L5-S1 disc replacement Electronically Signed   By: Alcide Clever M.D.   On: 03/13/2016 11:07   Dg C-arm 61-120 Min  Result Date: 03/13/2016 CLINICAL DATA:  Intraoperative localization for L5-S1 disc replacement EXAM: DG C-ARM 61-120 MIN; LUMBAR SPINE - 2-3 VIEW COMPARISON:  None. FLUOROSCOPY TIME:  Radiation Exposure Index (as provided by the fluoroscopic device): Not available If the device does not provide the exposure index: Fluoroscopy Time:  1 minutes 26 seconds Number of Acquired Images:  3 FINDINGS: The initial image demonstrates an anterior access with surgical retractors in place and a needle extending into the L5-S1 disc space. Subsequent images show disc replacement at L5-S1 in satisfactory position. IMPRESSION: L5-S1 disc replacement Electronically Signed   By: Alcide Clever M.D.   On: 03/13/2016 11:07   Dg Or Local Abdomen  Result Date: 03/13/2016 CLINICAL DATA:  Elective surgery. Rule out retained instrument. Disc replacement L5-S1 EXAM: LUMBAR SPINE - 1 VIEW COMPARISON:  None. FINDINGS: 2 AP views of the lumbar spine and pelvis were obtained. Disc prosthesis L5-S1 in satisfactory position. Surgical clips appear to be within bowel in the left abdomen. No prior studies for comparison. Extraperitoneal gas in the left  abdomen from surgical approach. IMPRESSION: Disc prosthesis L5-S1 in good position. Surgical clips in the left abdomen. These results were called by telephone at the time of interpretation on 03/13/2016 at 10:24 am to Dr. Harlan Stains RN, who verbally acknowledged these results. Electronically Signed   By: Marlan Palau M.D.   On: 03/13/2016 10:25      Follow-up Information    Alvy Beal, MD In 2 weeks.   Specialty:  Orthopedic Surgery Why:  If symptoms worsen, For suture removal, For wound re-check Contact information: [address] [place] [address] [phone]           Discharge Plan:  discharge to home.  Pt will present to clinic in 2 weeks.  Post op medication provided Disposition:     Signed: MayoBaxter Kail for Dr. Venita Lick [facility] [phone] 03/26/2016, 8:07 AM

## 2017-01-21 ENCOUNTER — Encounter: Payer: Self-pay | Admitting: Cardiology

## 2017-02-03 ENCOUNTER — Ambulatory Visit: Payer: Self-pay | Admitting: Cardiology

## 2017-02-09 ENCOUNTER — Ambulatory Visit (INDEPENDENT_AMBULATORY_CARE_PROVIDER_SITE_OTHER): Payer: Worker's Compensation | Admitting: Cardiology

## 2017-02-09 ENCOUNTER — Encounter: Payer: Self-pay | Admitting: Cardiology

## 2017-02-09 ENCOUNTER — Telehealth: Payer: Self-pay

## 2017-02-09 DIAGNOSIS — Z9884 Bariatric surgery status: Secondary | ICD-10-CM | POA: Diagnosis not present

## 2017-02-09 DIAGNOSIS — I495 Sick sinus syndrome: Secondary | ICD-10-CM | POA: Diagnosis not present

## 2017-02-09 DIAGNOSIS — M519 Unspecified thoracic, thoracolumbar and lumbosacral intervertebral disc disorder: Secondary | ICD-10-CM | POA: Insufficient documentation

## 2017-02-09 DIAGNOSIS — R011 Cardiac murmur, unspecified: Secondary | ICD-10-CM | POA: Diagnosis not present

## 2017-02-09 DIAGNOSIS — R000 Tachycardia, unspecified: Secondary | ICD-10-CM

## 2017-02-09 HISTORY — DX: Unspecified thoracic, thoracolumbar and lumbosacral intervertebral disc disorder: M51.9

## 2017-02-09 NOTE — Progress Notes (Signed)
Cardiology Office Note:    Date:  02/09/2017   ID:  Karla Duran, DOB [birth year], MRN [id number]  PCP:  Patient, No Pcp Per  Cardiologist:  Garwin Brothers, MD   Referring MD: No ref. provider found    ASSESSMENT:    1. Tachycardia-bradycardia syndrome (HCC)   2. Sinus tachycardia   3. Cardiac murmur   4. H/O bariatric surgery    PLAN:    In order of problems listed above:  1. I discussed my findings with the patient extensively and reassured her. Her heart rate is better today however her baseline heart rate is elevated and therefore I will obtain a TSH and a Holter monitor. Patient is asymptomatic from this standpoint. 2. In view of her diabetes mellitus and history of the condition I will obtain a Lexiscan sestamibi. Echocardiogram will be done to assess murmur heard on auscultation. Patient will be seen in follow-up appointment in a month or earlier if she has any concerns. She does not have any apparent cause for her tachycardia such as infection or any such issues.   Medication Adjustments/Labs and Tests Ordered: Current medicines are reviewed at length with the patient today.  Concerns regarding medicines are outlined above.  No orders of the defined types were placed in this encounter.  No orders of the defined types were placed in this encounter.    History of Present Illness:    Karla Duran is a 46 y.o. female who is being seen today for the evaluation of Tachycardia at the request of No ref. provider found. The patient is a pleasant 46 year old female. She has past medical history of obesity for which she underwent bariatric surgery. At that time she mentions to me that she's had diabetes mellitus but this resolved after the surgery. The patient is here for evaluation as she was found to be tachycardic. No chest pain orthopnea or PND. The patient has had history of diabetes mellitus before she underwent the bariatric surgery. She denies any  chest pain orthopnea or PND. She is referred here because she first found to be consistently tachycardic. At the time of my evaluation she is alert awake oriented and in no distress. Past Medical History:  Diagnosis Date  . Chronic pain syndrome   . 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 History of iron deficiency   . Knee pain   . Quadriceps tendonitis   . Wrist pain, acute, right     Past Surgical History:  Procedure Laterality Date  . ABDOMINAL EXPOSURE N/A 03/13/2016   Procedure: ABDOMINAL EXPOSURE;  Surgeon: Nada Libman, MD;  Location: [facility] OR;  Service: Vascular;  Laterality: N/A;  . ANTERIOR LUMBAR DISC ARTHROPLASTY N/A 03/13/2016   Procedure: TOTAL DISC REPLACEMENT L5-S1;  Surgeon: Venita Lick, MD;  Location: [facility] OR;  Service: Orthopedics;  Laterality: N/A;  . BARIATRIC SURGERY    . CHOLECYSTECTOMY      Current Medications: Current Meds  Medication Sig  . baclofen (LIORESAL) 10 MG tablet Take 10 mg by mouth daily.  Marland Kitchen LYRICA 75 MG capsule Take 75 mg by mouth 2 (two) times daily.  Marland Kitchen tiZANidine (ZANAFLEX) 4 MG tablet Take 4 mg by mouth daily.     Allergies:   Meloxicam; Celebrex [celecoxib]; and Sulfa antibiotics   Social History   Social History  . Marital status: Married    Spouse name: N/A  . Number of children: N/A  . Years of education: N/A   Social  History Main Topics  . Smoking status: Never Smoker  . Smokeless tobacco: Never Used  . Alcohol use 0.0 oz/week     Comment: occ.   . Drug use: No  . Sexual activity: Not Asked   Other Topics Concern  . None   Social History Narrative  . None     Family History: The patient's family history includes Diabetes in her father and mother; Kidney disease in her father.  ROS:   Please see the history of present illness.    All other systems reviewed and are negative.  EKGs/Labs/Other Studies Reviewed:    The following studies were reviewed today: I reviewed the office notes from  the referring physician. Her TSH was not found and therefore I will get this done today.   Recent Labs: 02/29/2016: BUN 9; Creatinine, Ser 0.69; Hemoglobin 6.7; Platelets 288; Potassium 4.0; Sodium 137  Recent Lipid Panel No results found for: CHOL, TRIG, HDL, CHOLHDL, VLDL, LDLCALC, LDLDIRECT  Physical Exam:    VS:  BP 112/78   Pulse 100   Ht 5\' 5"  (1.651 m)   Wt 157 lb 1.3 oz (71.3 kg)   SpO2 98%   BMI 26.14 kg/m     Wt Readings from Last 3 Encounters:  02/09/17 157 lb 1.3 oz (71.3 kg)  03/13/16 128 lb (58.1 kg)  03/10/16 128 lb (58.1 kg)     GEN: Patient is in no acute distress HEENT: Normal NECK: No JVD; No carotid bruits LYMPHATICS: No lymphadenopathy CARDIAC: S1 S2 regular, 2/6 systolic murmur at the apex. RESPIRATORY:  Clear to auscultation without rales, wheezing or rhonchi  ABDOMEN: Soft, non-tender, non-distended MUSCULOSKELETAL:  No edema; No deformity  SKIN: Warm and dry NEUROLOGIC:  Alert and oriented x 3 PSYCHIATRIC:  Normal affect    Signed, Garwin Brothersajan R Revankar, MD  02/09/2017 10:31 AM    [facility]

## 2017-02-09 NOTE — Addendum Note (Signed)
Addended by: Ayesha MohairWELLS, Bevelyn Arriola E on: 02/09/2017 10:43 AM   Modules accepted: Orders

## 2017-02-09 NOTE — Telephone Encounter (Signed)
Patient case worker called and asked that ov not e be faxed to [phone] 

## 2017-02-09 NOTE — Patient Instructions (Signed)
Medication Instructions:  Your physician recommends that you continue on your current medications as directed. Please refer to the Current Medication list given to you today.   Labwork: Your physician recommends that you return for lab work in: today. TSH   Testing/Procedures: Your physician has requested that you have an echocardiogram. Echocardiography is a painless test that uses sound waves to create images of your heart. It provides your doctor with information about the size and shape of your heart and how well your heart's chambers and valves are working. This procedure takes approximately one hour. There are no restrictions for this procedure.  Your physician has requested that you have a lexiscan myoview. For further information please visit [email or web address]. Please follow instruction sheet, as given.  Your physician has recommended that you wear a holter monitor. Holter monitors are medical devices that record the heart's electrical activity. Doctors most often use these monitors to diagnose arrhythmias. Arrhythmias are problems with the speed or rhythm of the heartbeat. The monitor is a small, portable device. You can wear one while you do your normal daily activities. This is usually used to diagnose what is causing palpitations/syncope (passing out).  You had an EKG today.  Follow-Up: Your physician recommends that you schedule a follow-up appointment in: 1 month.   Any Other Special Instructions Will Be Listed Below (If Applicable).     If you need a refill on your cardiac medications before your next appointment, please call your pharmacy.

## 2017-02-10 LAB — TSH: TSH: 1.68 u[IU]/mL (ref 0.450–4.500)

## 2017-02-18 ENCOUNTER — Telehealth (HOSPITAL_COMMUNITY): Payer: Self-pay | Admitting: *Deleted

## 2017-02-18 NOTE — Telephone Encounter (Signed)
Attempted to call patient regarding upcoming nuclear appointment- no answer, mailbox full.  Karla Duran, Karla Duran

## 2017-02-24 ENCOUNTER — Ambulatory Visit: Payer: Worker's Compensation

## 2017-02-24 ENCOUNTER — Ambulatory Visit (HOSPITAL_BASED_OUTPATIENT_CLINIC_OR_DEPARTMENT_OTHER): Payer: Worker's Compensation

## 2017-02-24 ENCOUNTER — Ambulatory Visit (HOSPITAL_COMMUNITY): Payer: Worker's Compensation | Attending: Cardiology

## 2017-02-24 ENCOUNTER — Other Ambulatory Visit: Payer: Self-pay

## 2017-02-24 DIAGNOSIS — I495 Sick sinus syndrome: Secondary | ICD-10-CM | POA: Insufficient documentation

## 2017-02-24 DIAGNOSIS — I358 Other nonrheumatic aortic valve disorders: Secondary | ICD-10-CM | POA: Diagnosis not present

## 2017-02-24 DIAGNOSIS — R000 Tachycardia, unspecified: Secondary | ICD-10-CM

## 2017-02-24 DIAGNOSIS — Z9884 Bariatric surgery status: Secondary | ICD-10-CM

## 2017-02-24 DIAGNOSIS — R011 Cardiac murmur, unspecified: Secondary | ICD-10-CM

## 2017-02-24 DIAGNOSIS — I340 Nonrheumatic mitral (valve) insufficiency: Secondary | ICD-10-CM | POA: Diagnosis not present

## 2017-02-24 MED ORDER — REGADENOSON 0.4 MG/5ML IV SOLN
0.4000 mg | Freq: Once | INTRAVENOUS | Status: AC
  Administered 2017-02-24: 0.4 mg via INTRAVENOUS

## 2017-02-24 MED ORDER — TECHNETIUM TC 99M TETROFOSMIN IV KIT
33.0000 | PACK | Freq: Once | INTRAVENOUS | Status: AC | PRN
  Administered 2017-02-24: 33 via INTRAVENOUS
  Filled 2017-02-24: qty 33

## 2017-02-24 MED ORDER — TECHNETIUM TC 99M TETROFOSMIN IV KIT
10.8000 | PACK | Freq: Once | INTRAVENOUS | Status: AC | PRN
  Administered 2017-02-24: 10.8 via INTRAVENOUS
  Filled 2017-02-24: qty 11

## 2017-02-25 LAB — MYOCARDIAL PERFUSION IMAGING
CHL CUP NUCLEAR SRS: 0
CHL CUP NUCLEAR SSS: 3
LHR: 0.34
LV sys vol: 38 mL
LVDIAVOL: 96 mL (ref 46–106)
Peak HR: 105 {beats}/min
Rest HR: 88 {beats}/min
SDS: 3
TID: 0.99

## 2017-03-09 ENCOUNTER — Ambulatory Visit (INDEPENDENT_AMBULATORY_CARE_PROVIDER_SITE_OTHER): Payer: Worker's Compensation | Admitting: Cardiology

## 2017-03-09 ENCOUNTER — Encounter: Payer: Self-pay | Admitting: Cardiology

## 2017-03-09 VITALS — BP 140/84 | HR 124 | Ht 65.0 in | Wt 152.1 lb

## 2017-03-09 DIAGNOSIS — R000 Tachycardia, unspecified: Secondary | ICD-10-CM

## 2017-03-09 NOTE — Progress Notes (Signed)
Cardiology Office Note:    Date:  03/09/2017   ID:  Karla BAIERL, DOB 1970/10/18, MRN [id number]  PCP:  Patient, No Pcp Per  Cardiologist:  Garwin Brothers, MD   Referring MD: No ref. provider found    ASSESSMENT:    1. Sinus tachycardia    PLAN:    In order of problems listed above:  1. I discussed my findings with the patient. She is tachycardic today at the office 2. She mentions to me that coming to the doctor's office versus significant stress on her and she knows that she is tachycardic at that time. I reassured her by explaining to her the findings of the Holter monitor. Also echocardiogram was unremarkable and stress test was negative for ischemia. She felt very "listing to the fact that her evaluation has come out unremarkable. She will be seen in follow-up appointment only on a when necessary basis.   Medication Adjustments/Labs and Tests Ordered: Current medicines are reviewed at length with the patient today.  Concerns regarding medicines are outlined above.  No orders of the defined types were placed in this encounter.  No orders of the defined types were placed in this encounter.    Chief Complaint  Patient presents with  . Follow-up    No Concerns      History of Present Illness:    Karla Duran is a 46 y.o. female.She was found to be tachycardic incidentally when she was in the doctor's office. Patient is a pleasant female. She is accompanied by her caseworker. She tells me that she never has any significant symptoms. She does not feel any palpitations or tachycardia. At the time of my evaluation is alert awake oriented and in no distress.  Past Medical History:  Diagnosis Date  . Chronic pain syndrome   . Disc disorder of lumbosacral region 02/09/2017   Replacement of disc L5-S1  . 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 History of iron deficiency   . Knee pain   . Quadriceps tendonitis   . Wrist pain,  acute, right     Past Surgical History:  Procedure Laterality Date  . ABDOMINAL EXPOSURE N/A 03/13/2016   Procedure: ABDOMINAL EXPOSURE;  Surgeon: Nada Libman, MD;  Location: [facility];  Service: Vascular;  Laterality: N/A;  . ANTERIOR LUMBAR DISC ARTHROPLASTY N/A 03/13/2016   Procedure: TOTAL DISC REPLACEMENT L5-S1;  Surgeon: Venita Lick, MD;  Location: [facility] OR;  Service: Orthopedics;  Laterality: N/A;  . BARIATRIC SURGERY    . CHOLECYSTECTOMY      Current Medications: Current Meds  Medication Sig  . baclofen (LIORESAL) 10 MG tablet Take 10 mg by mouth daily.  Marland Kitchen LYRICA 75 MG capsule Take 75 mg by mouth 2 (two) times daily.  Marland Kitchen tiZANidine (ZANAFLEX) 4 MG tablet Take 4 mg by mouth daily.  . traMADol (ULTRAM) 50 MG tablet Take 50 mg by mouth daily as needed.     Allergies:   Meloxicam; Celebrex [celecoxib]; and Sulfa antibiotics   Social History   Social History  . Marital status: Married    Spouse name: N/A  . Number of children: N/A  . Years of education: N/A   Social History Main Topics  . Smoking status: Never Smoker  . Smokeless tobacco: Never Used  . Alcohol use 0.0 oz/week     Comment: occ.   . Drug use: No  . Sexual activity: Not Asked   Other Topics Concern  . None  Social History Narrative  . None     Family History: The patient's family history includes Diabetes in her father and mother; Kidney disease in her father.  ROS:   Please see the history of present illness.    All other systems reviewed and are negative.  EKGs/Labs/Other Studies Reviewed:    The following studies were reviewed today: I reviewed code echocardiogram, Holter monitor and stress test report with her at extensive length. She vocalized understanding. Questions were answered to her satisfaction today   Recent Labs: 02/09/2017: TSH 1.680  Recent Lipid Panel No results found for: CHOL, TRIG, HDL, CHOLHDL, VLDL, LDLCALC, LDLDIRECT  Physical Exam:    VS:  BP 140/84   Pulse (!)  124   Ht 5\' 5"  (1.651 m)   Wt 152 lb 1.9 oz (69 kg)   SpO2 99%   BMI 25.31 kg/m     Wt Readings from Last 3 Encounters:  03/09/17 152 lb 1.9 oz (69 kg)  02/09/17 157 lb 1.3 oz (71.3 kg)  03/13/16 128 lb (58.1 kg)     GEN: Patient is in no acute distress HEENT: Normal NECK: No JVD; No carotid bruits LYMPHATICS: No lymphadenopathy CARDIAC: Hear sounds regular, 2/6 systolic murmur at the apex. RESPIRATORY:  Clear to auscultation without rales, wheezing or rhonchi  ABDOMEN: Soft, non-tender, non-distended MUSCULOSKELETAL:  No edema; No deformity  SKIN: Warm and dry NEUROLOGIC:  Alert and oriented x 3 PSYCHIATRIC:  Normal affect   Signed, Garwin Brothers, MD  03/09/2017 11:19 AM    [facility]

## 2017-04-08 ENCOUNTER — Telehealth: Payer: Self-pay | Admitting: Cardiology

## 2017-04-08 NOTE — Telephone Encounter (Signed)
Never received her information for her workmans comp case

## 2017-04-09 IMAGING — CR DG LUMBAR SPINE 1V
1 series · 1 of 1 positions shown · non-contrast
Comparison: None.

CLINICAL DATA: Elective surgery. Rule out retained instrument. Disc
replacement L5-S1

EXAM:
LUMBAR SPINE - 1 VIEW

[AP]
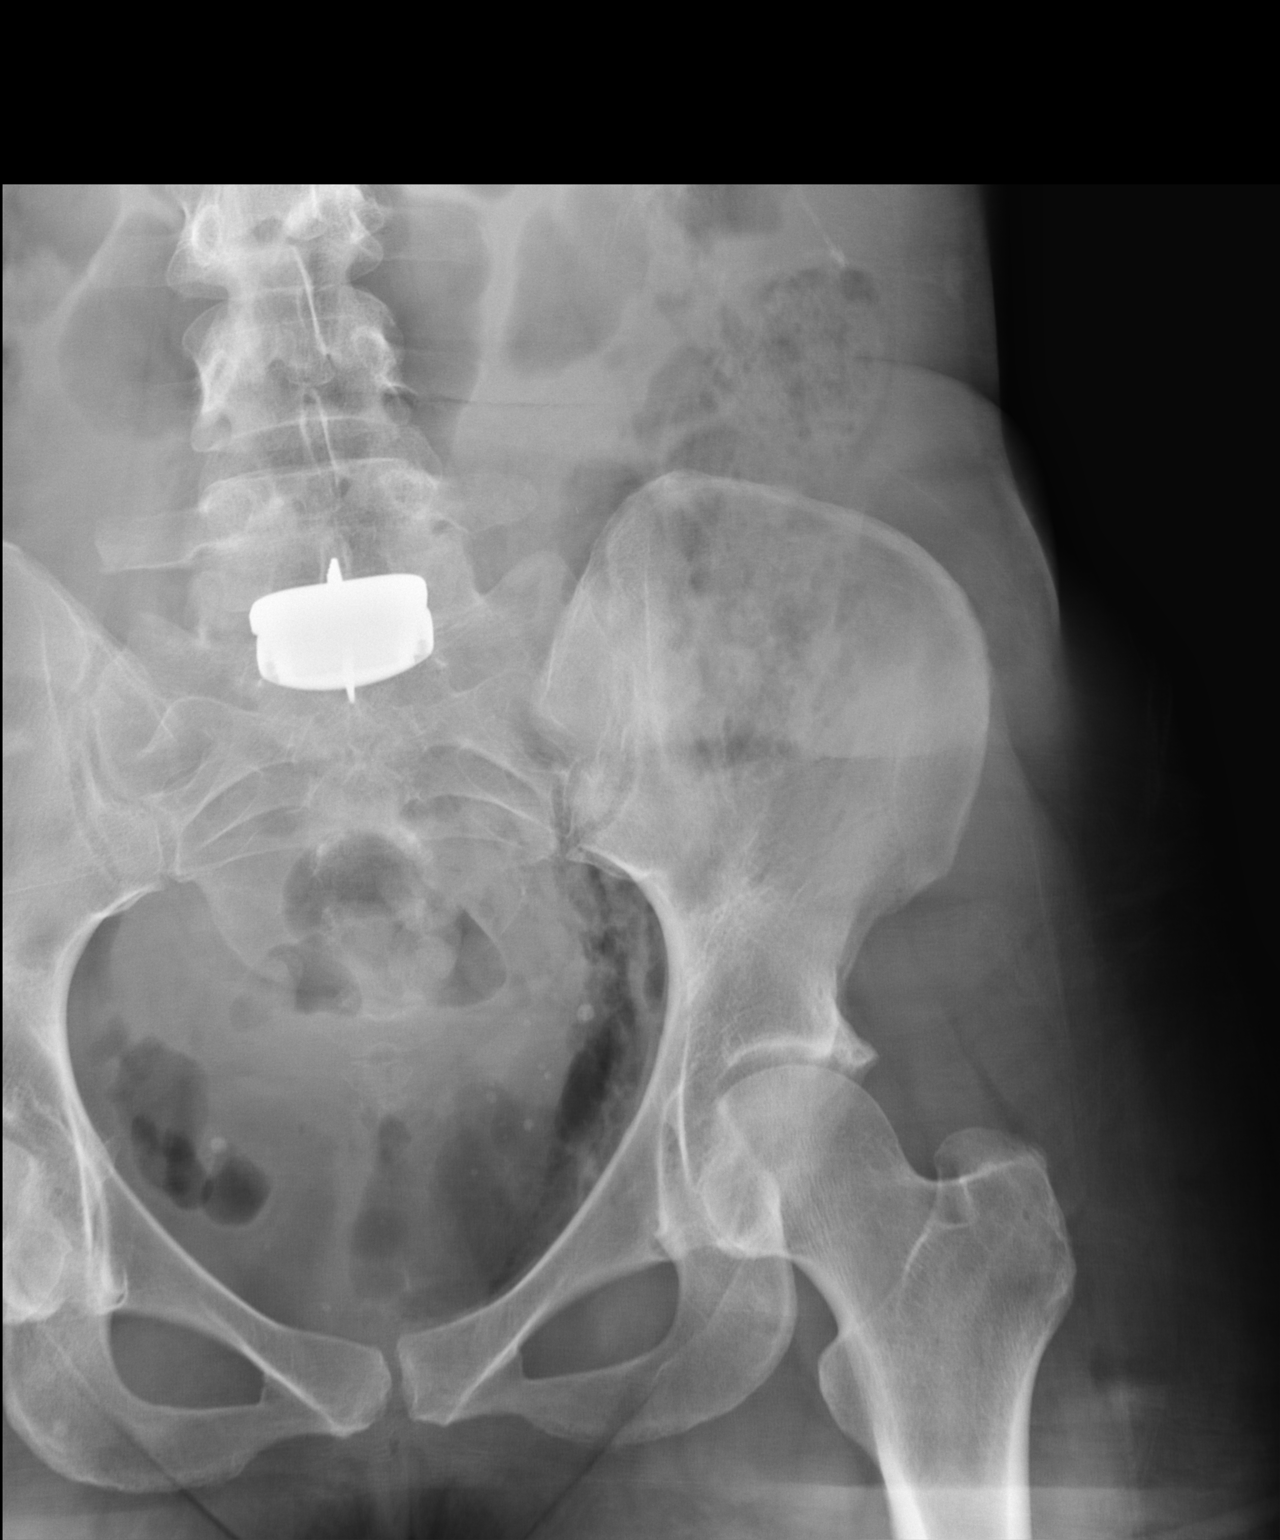

[1 of 1 positions shown; findings below may reference images not displayed]

FINDINGS: 2 AP views of the lumbar spine and pelvis were obtained.

Disc prosthesis L5-S1 in satisfactory position.

Surgical clips appear to be within bowel in the left abdomen. No
prior studies for comparison.

Extraperitoneal gas in the left abdomen from surgical approach.
IMPRESSION: Disc prosthesis L5-S1 in good position.

Surgical clips in the left abdomen.

These results were called by telephone at the time of interpretation
acknowledged these results.

## 2017-04-10 NOTE — Telephone Encounter (Signed)
Notes has been faxed.

## 2017-06-13 ENCOUNTER — Encounter (HOSPITAL_COMMUNITY): Payer: Self-pay

## 2017-06-13 ENCOUNTER — Emergency Department (HOSPITAL_COMMUNITY): Payer: BC Managed Care – PPO

## 2017-06-13 ENCOUNTER — Emergency Department (HOSPITAL_COMMUNITY)
Admission: EM | Admit: 2017-06-13 | Discharge: 2017-06-13 | Disposition: A | Discharge status: Home / Self Care | Payer: BC Managed Care – PPO | Attending: Emergency Medicine | Admitting: Emergency Medicine

## 2017-06-13 DIAGNOSIS — R42 Dizziness and giddiness: Secondary | ICD-10-CM | POA: Diagnosis not present

## 2017-06-13 DIAGNOSIS — S0990XA Unspecified injury of head, initial encounter: Secondary | ICD-10-CM

## 2017-06-13 DIAGNOSIS — Y929 Unspecified place or not applicable: Secondary | ICD-10-CM | POA: Insufficient documentation

## 2017-06-13 DIAGNOSIS — G8929 Other chronic pain: Secondary | ICD-10-CM | POA: Insufficient documentation

## 2017-06-13 DIAGNOSIS — M542 Cervicalgia: Secondary | ICD-10-CM | POA: Insufficient documentation

## 2017-06-13 DIAGNOSIS — Y999 Unspecified external cause status: Secondary | ICD-10-CM | POA: Diagnosis not present

## 2017-06-13 DIAGNOSIS — R51 Headache: Secondary | ICD-10-CM | POA: Diagnosis not present

## 2017-06-13 DIAGNOSIS — Z79899 Other long term (current) drug therapy: Secondary | ICD-10-CM | POA: Diagnosis not present

## 2017-06-13 DIAGNOSIS — W01198A Fall on same level from slipping, tripping and stumbling with subsequent striking against other object, initial encounter: Secondary | ICD-10-CM | POA: Diagnosis not present

## 2017-06-13 DIAGNOSIS — M545 Low back pain: Secondary | ICD-10-CM | POA: Diagnosis not present

## 2017-06-13 DIAGNOSIS — Y939 Activity, unspecified: Secondary | ICD-10-CM | POA: Insufficient documentation

## 2017-06-13 MED ORDER — MECLIZINE HCL 25 MG PO TABS: 25.0000 mg | ORAL_TABLET | Freq: Three times a day (TID) | ORAL | 0 refills | Status: DC | PRN

## 2017-06-13 MED ORDER — MECLIZINE HCL 25 MG PO TABS
25.0000 mg | ORAL_TABLET | Freq: Once | ORAL | Status: AC
  Administered 2017-06-13: 25 mg via ORAL
  Filled 2017-06-13: qty 1

## 2017-06-13 NOTE — Discharge Instructions (Signed)
Continue your regular medications.  You can take ibuprofen in addition for headache as needed.  Take meclizine for dizziness.  Follow-up with family doctor or your pain doctor as needed.

## 2017-06-13 NOTE — ED Provider Notes (Signed)
[facility] EMERGENCY DEPARTMENT Provider Note   CSN: [id number] Arrival date & time: 06/13/17  1028     History   Chief Complaint No chief complaint on file.   HPI Karla Duran is a 46 y.o. female.  HPI Karla Duran is a 46 y.o. female with chronic back pain, presents to emergency department after a fall.  Patient states that she has chronic right-sided leg weakness from back injury, states yesterday she was walking in her right leg gave out and she fell striking her head on the ground.  She states she had back of her head.  She states that she had a scarf on and it soft in the head.  She denies loss of consciousness.  She states that she is having a headache and dizziness.  She states that her dizziness is worse with any movement.  She denies any nausea or vomiting.  She denies any new numbness or weakness in extremities.  She does report neck pain.  She took ibuprofen for pain last night.  Took a regular baclofen that she takes daily this morning.  She states it helped her neck pain.  No other injuries.   Past Medical History:  Diagnosis Date  . Chronic pain syndrome   . Disc disorder of lumbosacral region 02/09/2017   Replacement of disc L5-S1  . 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 History of iron deficiency   . Knee pain   . Quadriceps tendonitis   . Wrist pain, acute, right     Patient Active Problem List   Diagnosis Date Noted  . Sinus tachycardia 02/09/2017  . Cardiac murmur 02/09/2017  . H/O bariatric surgery 02/09/2017  . Disc disorder of lumbosacral region 02/09/2017  . Back pain 03/13/2016  . ANEMIA-NOS 06/23/2007  . ALLERGIC RHINITIS 06/23/2007    Past Surgical History:  Procedure Laterality Date  . ABDOMINAL EXPOSURE N/A 03/13/2016   Procedure: ABDOMINAL EXPOSURE;  Surgeon: Nada LibmanVance W Brabham, MD;  [facility];  Service: Vascular;  Laterality: N/A;  . ANTERIOR LUMBAR DISC ARTHROPLASTY  N/A 03/13/2016   Procedure: TOTAL DISC REPLACEMENT L5-S1;  Surgeon: Venita Lickahari Brooks, MD;  Location: [facility] OR;  Service: Orthopedics;  Laterality: N/A;  . BARIATRIC SURGERY    . CHOLECYSTECTOMY      OB History    No data available       Home Medications    Prior to Admission medications   Medication Sig Start Date End Date Taking? Authorizing Provider  baclofen (LIORESAL) 10 MG tablet Take 10 mg by mouth daily. 01/08/17   [provider]  LYRICA 75 MG capsule Take 75 mg by mouth 2 (two) times daily. 02/02/17   [provider]  tiZANidine (ZANAFLEX) 4 MG tablet Take 4 mg by mouth daily. 02/04/17   [provider]  traMADol (ULTRAM) 50 MG tablet Take 50 mg by mouth daily as needed. 02/16/17   [provider]    Family History Family History  Problem Relation Age of Onset  . Diabetes Mother   . Kidney disease Father   . Diabetes Father     Social History Social History   Tobacco Use  . Smoking status: Never Smoker  . Smokeless tobacco: Never Used  Substance Use Topics  . Alcohol use: Yes    Alcohol/week: 0.0 oz    Comment: occ.   . Drug use: No     Allergies   Meloxicam; Celebrex [celecoxib]; and Sulfa antibiotics  Review of Systems Review of Systems  Constitutional: Negative for chills and fever.  Eyes: Negative for visual disturbance.  Respiratory: Negative for cough, chest tightness and shortness of breath.   Cardiovascular: Negative for chest pain, palpitations and leg swelling.  Gastrointestinal: Negative for abdominal pain, diarrhea, nausea and vomiting.  Musculoskeletal: Positive for neck pain. Negative for arthralgias, myalgias and neck stiffness.  Skin: Negative for rash.  Neurological: Positive for dizziness, light-headedness and headaches. Negative for weakness.  All other systems reviewed and are negative.    Physical Exam Updated Vital Signs BP (!) 142/80   Pulse 98   Temp 99.1 F (37.3 C) (Oral)   Resp 17   SpO2  100%   Physical Exam  Constitutional: She appears well-developed and well-nourished. No distress.  HENT:  Head: Normocephalic.  No hemotympanum bilaterally  Eyes: Conjunctivae are normal.  Neck: Neck supple.  No midline tenderness.  No swelling, deformity, step-offs.  Strength is intact in all 4 directions against resistance.  Cardiovascular: Normal rate, regular rhythm and normal heart sounds.  Pulmonary/Chest: Effort normal and breath sounds normal. No respiratory distress. She has no wheezes. She has no rales.  Abdominal: Soft. Bowel sounds are normal. She exhibits no distension. There is no tenderness. There is no rebound.  Musculoskeletal: She exhibits no edema.  Full range of motion of all extremities.  Neurological: She is alert.  5/5 and equal upper and lower extremity strength bilaterally. Equal grip strength bilaterally. Normal finger to nose and heel to shin. No pronator drift.   Skin: Skin is warm and dry.  Psychiatric: She has a normal mood and affect. Her behavior is normal.  Nursing note and vitals reviewed.    ED Treatments / Results  Labs (all labs ordered are listed, but only abnormal results are displayed) Labs Reviewed - No data to display  EKG  EKG Interpretation None       Radiology Ct Head Wo Contrast  Result Date: 06/13/2017 CLINICAL DATA:  Posttraumatic headache after fall today. No loss of consciousness. EXAM: CT HEAD WITHOUT CONTRAST TECHNIQUE: Contiguous axial images were obtained from the base of the skull through the vertex without intravenous contrast. COMPARISON:  None. FINDINGS: Brain: No evidence of acute infarction, hemorrhage, hydrocephalus, extra-axial collection or mass lesion/mass effect. Vascular: No hyperdense vessel or unexpected calcification. Skull: Normal. Negative for fracture or focal lesion. Sinuses/Orbits: No acute finding. Other: None. IMPRESSION: Normal head CT. Electronically Signed   By: Lupita RaiderJames  Green Jr, M.D.   On: 06/13/2017  12:01    Procedures Procedures (including critical care time)  Medications Ordered in ED Medications - No data to display   Initial Impression / Assessment and Plan / ED Course  I have reviewed the triage vital signs and the nursing notes.  Pertinent labs & imaging results that were available during my care of the patient were reviewed by me and considered in my medical decision making (see chart for details).     Patient in the emergency department after a fall last night, sustaining head injury.  She is continued to have severe headache and dizziness since the fall.  She is not anticoagulated.  No focal neurological findings on exam.  Will get CT head to rule out intracranial injury.  Will try meclizine for dizziness.  CT head negative. Pt feels better with meclizine. Most likely post concussive syndrome. Home with meclizine as needed. Follow up with pcp. Return precautions discussed.   Vitals:   06/13/17 1045 06/13/17 1100 06/13/17 1115 06/13/17  1256  BP: (!) 142/80 113/84 106/89 (!) 136/99  Pulse: 98 91 89 78  Resp:   18 14  Temp:      TempSrc:      SpO2: 100% 100% 100% 100%     Final Clinical Impressions(s) / ED Diagnoses   Final diagnoses:  Injury of head, initial encounter  Vertigo    ED Discharge Orders    None       Jaynie Crumble, PA-C 06/13/17 1652    Mesner, Barbara Cower, MD 06/14/17 [phone]

## 2017-06-13 NOTE — ED Triage Notes (Signed)
Patient complains of chronic back problems with leg numbness and slipped yesterday striking back of head, no loc. Alert and oriented, here for some recurrent dizziness this am.

## 2019-10-06 ENCOUNTER — Ambulatory Visit: Payer: Self-pay | Attending: Family

## 2019-10-06 DIAGNOSIS — Z23 Encounter for immunization: Secondary | ICD-10-CM

## 2019-10-06 NOTE — Progress Notes (Signed)
   Covid-19 Vaccination Clinic  Name:  Karla Duran    MRN: [id number] DOB: 04/11/1971  10/06/2019  Karla Duran was observed post Covid-19 immunization for 15 minutes without incident. She was provided with Vaccine Information Sheet and instruction to access the V-Safe system.   Karla Duran was instructed to call 911 with any severe reactions post vaccine: Marland Kitchen Difficulty breathing  . Swelling of face and throat  . A fast heartbeat  . A bad rash all over body  . Dizziness and weakness   Immunizations Administered    Name Date Dose VIS Date Route   Moderna COVID-19 Vaccine 10/06/2019 10:39 AM 0.5 mL 06/21/2019 Intramuscular   Manufacturer: Moderna   Lot: [id number]   NDC: 58527-782-42

## 2019-11-08 ENCOUNTER — Ambulatory Visit: Payer: Self-pay | Attending: Family

## 2019-11-08 DIAGNOSIS — Z23 Encounter for immunization: Secondary | ICD-10-CM

## 2019-11-08 NOTE — Progress Notes (Signed)
   Covid-19 Vaccination Clinic  Name:  Karla Duran    MRN: [id number] DOB: [birth year]  11/08/2019  [name] was observed post Covid-19 immunization for 15 minutes without incident. She was provided with Vaccine Information Sheet and instruction to access the V-Safe system.   [name] was instructed to call 911 with any severe reactions post vaccine: Marland Kitchen Difficulty breathing  . Swelling of face and throat  . A fast heartbeat  . A bad rash all over body  . Dizziness and weakness   Immunizations Administered    Name Date Dose VIS Date Route   Moderna COVID-19 Vaccine 11/08/2019 10:28 AM 0.5 mL 06/2019 Intramuscular   Manufacturer: Moderna   Lot: [id number]   NDC: 17530-104-04

## 2020-01-19 ENCOUNTER — Emergency Department (HOSPITAL_COMMUNITY): Payer: BC Managed Care – PPO

## 2020-01-19 ENCOUNTER — Other Ambulatory Visit: Payer: Self-pay

## 2020-01-19 ENCOUNTER — Encounter (HOSPITAL_COMMUNITY): Payer: Self-pay | Admitting: Emergency Medicine

## 2020-01-19 ENCOUNTER — Emergency Department (HOSPITAL_COMMUNITY)
Admission: EM | Admit: 2020-01-19 | Discharge: 2020-01-19 | Disposition: A | Discharge status: Home / Self Care | Payer: BC Managed Care – PPO | Attending: Emergency Medicine | Admitting: Emergency Medicine

## 2020-01-19 DIAGNOSIS — S39012A Strain of muscle, fascia and tendon of lower back, initial encounter: Secondary | ICD-10-CM | POA: Diagnosis not present

## 2020-01-19 DIAGNOSIS — Y999 Unspecified external cause status: Secondary | ICD-10-CM | POA: Diagnosis not present

## 2020-01-19 DIAGNOSIS — Z79899 Other long term (current) drug therapy: Secondary | ICD-10-CM | POA: Diagnosis not present

## 2020-01-19 DIAGNOSIS — S62201A Unspecified fracture of first metacarpal bone, right hand, initial encounter for closed fracture: Secondary | ICD-10-CM | POA: Insufficient documentation

## 2020-01-19 DIAGNOSIS — Y9389 Activity, other specified: Secondary | ICD-10-CM | POA: Diagnosis not present

## 2020-01-19 DIAGNOSIS — Y92410 Unspecified street and highway as the place of occurrence of the external cause: Secondary | ICD-10-CM | POA: Insufficient documentation

## 2020-01-19 DIAGNOSIS — S161XXA Strain of muscle, fascia and tendon at neck level, initial encounter: Secondary | ICD-10-CM | POA: Insufficient documentation

## 2020-01-19 DIAGNOSIS — S6991XA Unspecified injury of right wrist, hand and finger(s), initial encounter: Secondary | ICD-10-CM | POA: Diagnosis present

## 2020-01-19 MED ORDER — TIZANIDINE HCL 4 MG PO TABS
4.0000 mg | ORAL_TABLET | Freq: Once | ORAL | Status: AC
  Administered 2020-01-19: 4 mg via ORAL
  Filled 2020-01-19: qty 1

## 2020-01-19 MED ORDER — OXYCODONE-ACETAMINOPHEN 5-325 MG PO TABS: 1.0000 | ORAL_TABLET | Freq: Four times a day (QID) | ORAL | 0 refills | Status: DC | PRN

## 2020-01-19 MED ORDER — IBUPROFEN 400 MG PO TABS
400.0000 mg | ORAL_TABLET | Freq: Once | ORAL | Status: AC | PRN
  Administered 2020-01-19: 400 mg via ORAL
  Filled 2020-01-19: qty 1

## 2020-01-19 MED ORDER — OXYCODONE-ACETAMINOPHEN 5-325 MG PO TABS
2.0000 | ORAL_TABLET | Freq: Once | ORAL | Status: AC
  Administered 2020-01-19: 2 via ORAL
  Filled 2020-01-19: qty 2
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
## 2020-01-19 NOTE — Progress Notes (Signed)
Orthopedic Tech Progress Note Patient Details:  CHANELE DOUGLAS [birth year] [id number]  Ortho Devices Type of Ortho Device: Thumb spica splint Splint Material: Fiberglass Ortho Device/Splint Location: Right Thumb Ortho Device/Splint Interventions: Application   Post Interventions Patient Tolerated: Well Instructions Provided: Care of device   Shakita Keir E Usbaldo Pannone 01/19/2020, 11:09 PM
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
## 2020-01-22 ENCOUNTER — Other Ambulatory Visit: Payer: Self-pay

## 2020-01-22 ENCOUNTER — Emergency Department (HOSPITAL_COMMUNITY)
Admission: EM | Admit: 2020-01-22 | Discharge: 2020-01-22 | Disposition: A | Discharge status: Home / Self Care | Payer: BC Managed Care – PPO | Attending: Emergency Medicine | Admitting: Emergency Medicine

## 2020-01-22 ENCOUNTER — Encounter (HOSPITAL_COMMUNITY): Payer: Self-pay | Admitting: Emergency Medicine

## 2020-01-22 ENCOUNTER — Emergency Department (HOSPITAL_COMMUNITY): Payer: BC Managed Care – PPO

## 2020-01-22 DIAGNOSIS — Z79899 Other long term (current) drug therapy: Secondary | ICD-10-CM | POA: Insufficient documentation

## 2020-01-22 DIAGNOSIS — R1032 Left lower quadrant pain: Secondary | ICD-10-CM | POA: Diagnosis not present

## 2020-01-22 DIAGNOSIS — R1012 Left upper quadrant pain: Secondary | ICD-10-CM | POA: Insufficient documentation

## 2020-01-22 DIAGNOSIS — D649 Anemia, unspecified: Secondary | ICD-10-CM | POA: Diagnosis not present

## 2020-01-22 LAB — I-STAT BETA HCG BLOOD, ED (MC, WL, AP ONLY): I-stat hCG, quantitative: <5 m[IU]/mL (ref <5)

## 2020-01-22 LAB — CBC
HCT: 23.5 % — ABNORMAL LOW (ref 36.0–46.0)
Hemoglobin: 6.1 g/dL — CL (ref 12.0–15.0)
MCH: 18.8 pg — ABNORMAL LOW (ref 26.0–34.0)
MCHC: 26 g/dL — ABNORMAL LOW (ref 30.0–36.0)
MCV: 72.3 fL — ABNORMAL LOW (ref 80.0–100.0)
Platelets: 361 10*3/uL (ref 150–400)
RBC: 3.25 MIL/uL — ABNORMAL LOW (ref 3.87–5.11)
RDW: 22.8 % — ABNORMAL HIGH (ref 11.5–15.5)
WBC: 6.1 10*3/uL (ref 4.0–10.5)
nRBC: 0 % (ref 0.0–0.2)

## 2020-01-22 LAB — COMPREHENSIVE METABOLIC PANEL
ALT: 27 U/L (ref 0–44)
AST: 42 U/L — ABNORMAL HIGH (ref 15–41)
Albumin: 3.2 g/dL — ABNORMAL LOW (ref 3.5–5.0)
Alkaline Phosphatase: 81 U/L (ref 38–126)
Anion gap: 8 (ref 5–15)
BUN: 11 mg/dL (ref 6–20)
CO2: 23 mmol/L (ref 22–32)
Calcium: 8.8 mg/dL — ABNORMAL LOW (ref 8.9–10.3)
Chloride: 108 mmol/L (ref 98–111)
Creatinine, Ser: 0.8 mg/dL (ref 0.44–1.00)
GFR calc Af Amer: >60 mL/min (ref >60)
GFR calc non Af Amer: >60 mL/min (ref >60)
Glucose, Bld: 114 mg/dL — ABNORMAL HIGH (ref 70–99)
Potassium: 4.2 mmol/L (ref 3.5–5.1)
Sodium: 139 mmol/L (ref 135–145)
Total Bilirubin: 0.4 mg/dL (ref 0.3–1.2)
Total Protein: 7.7 g/dL (ref 6.5–8.1)

## 2020-01-22 LAB — POC OCCULT BLOOD, ED: Fecal Occult Bld: NEGATIVE

## 2020-01-22 LAB — PREPARE RBC (CROSSMATCH)

## 2020-01-22 LAB — LIPASE, BLOOD: Lipase: 35 U/L (ref 11–51)

## 2020-01-22 MED ORDER — SODIUM CHLORIDE 0.9% FLUSH
3.0000 mL | Freq: Once | INTRAVENOUS | Status: AC
  Administered 2020-01-22: 3 mL via INTRAVENOUS

## 2020-01-22 MED ORDER — FENTANYL CITRATE (PF) 100 MCG/2ML IJ SOLN
50.0000 ug | Freq: Once | INTRAMUSCULAR | Status: AC
  Administered 2020-01-22: 50 ug via INTRAVENOUS
  Filled 2020-01-22: qty 2

## 2020-01-22 MED ORDER — OXYCODONE-ACETAMINOPHEN 5-325 MG PO TABS
1.0000 | ORAL_TABLET | Freq: Once | ORAL | Status: AC
  Administered 2020-01-22: 1 via ORAL
  Filled 2020-01-22: qty 1

## 2020-01-22 MED ORDER — IOHEXOL 300 MG/ML  SOLN
100.0000 mL | Freq: Once | INTRAMUSCULAR | Status: AC | PRN
  Administered 2020-01-22: 100 mL via INTRAVENOUS

## 2020-01-22 MED ORDER — SODIUM CHLORIDE 0.9 % IV SOLN
10.0000 mL/h | Freq: Once | INTRAVENOUS | Status: DC
Start: 2020-01-22 — End: 2020-01-23
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
## 2020-01-22 NOTE — ED Notes (Signed)
Discharge instructions discussed with pt. Pt verbalized understanding. Pt stable and ambulatory. No signature pad available. 

## 2020-01-23 LAB — TYPE AND SCREEN
ABO/RH(D): O POS
Antibody Screen: NEGATIVE
Unit division: 0

## 2020-01-23 LAB — BPAM RBC
Blood Product Expiration Date: 202107242359
ISSUE DATE / TIME: 202107041659
Unit Type and Rh: 5100

## 2020-03-15 ENCOUNTER — Telehealth: Payer: Self-pay | Admitting: General Practice

## 2020-03-15 NOTE — Telephone Encounter (Signed)
Attempted to call patient to schedule new patient appointment but per patient she will call our office back when ready to schedule.

## 2020-04-30 ENCOUNTER — Ambulatory Visit: Payer: BC Managed Care – PPO | Attending: Family

## 2020-04-30 DIAGNOSIS — Z23 Encounter for immunization: Secondary | ICD-10-CM

## 2020-05-03 ENCOUNTER — Encounter: Payer: Self-pay | Admitting: *Deleted

## 2020-05-03 ENCOUNTER — Other Ambulatory Visit: Payer: Self-pay | Admitting: *Deleted

## 2020-05-08 ENCOUNTER — Ambulatory Visit: Payer: BC Managed Care – PPO | Admitting: Diagnostic Neuroimaging

## 2020-05-08 ENCOUNTER — Other Ambulatory Visit: Payer: Self-pay

## 2020-05-08 ENCOUNTER — Encounter: Payer: Self-pay | Admitting: Diagnostic Neuroimaging

## 2020-05-08 VITALS — BP 123/90 | HR 123 | Ht 65.0 in | Wt 142.6 lb

## 2020-05-08 DIAGNOSIS — Q048 Other specified congenital malformations of brain: Secondary | ICD-10-CM | POA: Diagnosis not present

## 2020-05-08 DIAGNOSIS — F0781 Postconcussional syndrome: Secondary | ICD-10-CM
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
## 2020-05-08 NOTE — Patient Instructions (Signed)
  MILD CEREBELLAR TONSILLAR ECTOPIA (vs mild chiari malformation) - likely incidental, mild finding; no further testing / treatment advised at this time   POST-CONCUSSION SYNDROME / HEADACHES - supportive care; continue pain mgmt   INTERMITTENT DYSPHAGIA - follow up with PCP; consider swallow testing / GI evaluation

## 2020-05-25 ENCOUNTER — Encounter: Payer: Self-pay | Admitting: Physician Assistant

## 2020-06-11 ENCOUNTER — Ambulatory Visit: Payer: BC Managed Care – PPO | Admitting: Physician Assistant
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
## 2021-03-07 ENCOUNTER — Ambulatory Visit: Payer: Self-pay | Attending: Family

## 2021-03-07 DIAGNOSIS — Z23 Encounter for immunization: Secondary | ICD-10-CM

## 2021-03-07 NOTE — Progress Notes (Signed)
   Covid-19 Vaccination Clinic  Name:  Karla Duran    MRN: [id number] DOB: 06/15/1971  03/07/2021  [name] was observed post Covid-19 immunization for 15 minutes without incident. She was provided with Vaccine Information Sheet and instruction to access the V-Safe system.   [name] was instructed to call 911 with any severe reactions post vaccine: Difficulty breathing  Swelling of face and throat  A fast heartbeat  A bad rash all over body  Dizziness and weakness   Immunizations Administered     Name Date Dose VIS Date Route   Moderna Covid-19 Booster Vaccine 03/07/2021  4:00 PM 0.25 mL 05/09/2020 Intramuscular   Manufacturer: Moderna   Lot: [id number]   NDC: 96438-381-84

## 2023-10-30 ENCOUNTER — Emergency Department (HOSPITAL_COMMUNITY)
Admission: EM | Admit: 2023-10-30 | Discharge: 2023-10-30 | Disposition: A | Discharge status: Home / Self Care | Attending: Emergency Medicine | Admitting: Emergency Medicine

## 2023-10-30 ENCOUNTER — Other Ambulatory Visit: Payer: Self-pay

## 2023-10-30 DIAGNOSIS — M545 Low back pain, unspecified: Secondary | ICD-10-CM | POA: Insufficient documentation

## 2023-10-30 DIAGNOSIS — M5441 Lumbago with sciatica, right side: Secondary | ICD-10-CM

## 2023-10-30 LAB — CBC WITH DIFFERENTIAL/PLATELET
Abs Immature Granulocytes: 0.01 10*3/uL (ref 0.00–0.07)
Basophils Absolute: 0 10*3/uL (ref 0.0–0.1)
Basophils Relative: 0 %
Eosinophils Absolute: 0 10*3/uL (ref 0.0–0.5)
Eosinophils Relative: 0 %
HCT: 29 % — ABNORMAL LOW (ref 36.0–46.0)
Hemoglobin: 8.5 g/dL — ABNORMAL LOW (ref 12.0–15.0)
Immature Granulocytes: 0 %
Lymphocytes Relative: 12 %
Lymphs Abs: 0.7 10*3/uL (ref 0.7–4.0)
MCH: 21.9 pg — ABNORMAL LOW (ref 26.0–34.0)
MCHC: 29.3 g/dL — ABNORMAL LOW (ref 30.0–36.0)
MCV: 74.6 fL — ABNORMAL LOW (ref 80.0–100.0)
Monocytes Absolute: 0 10*3/uL — ABNORMAL LOW (ref 0.1–1.0)
Monocytes Relative: 1 %
Neutro Abs: 5.2 10*3/uL (ref 1.7–7.7)
Neutrophils Relative %: 87 %
Platelets: 474 10*3/uL — ABNORMAL HIGH (ref 150–400)
RBC: 3.89 MIL/uL (ref 3.87–5.11)
RDW: 20.3 % — ABNORMAL HIGH (ref 11.5–15.5)
WBC: 6 10*3/uL (ref 4.0–10.5)
nRBC: 0 % (ref 0.0–0.2)

## 2023-10-30 LAB — URINALYSIS, ROUTINE W REFLEX MICROSCOPIC
Bilirubin Urine: NEGATIVE
Glucose, UA: NEGATIVE mg/dL
Hgb urine dipstick: NEGATIVE
Ketones, ur: NEGATIVE mg/dL
Nitrite: NEGATIVE
Protein, ur: NEGATIVE mg/dL
Specific Gravity, Urine: 1.01 (ref 1.005–1.030)
pH: 5 (ref 5.0–8.0)

## 2023-10-30 LAB — BASIC METABOLIC PANEL WITH GFR
Anion gap: 7 (ref 5–15)
BUN: 18 mg/dL (ref 6–20)
CO2: 22 mmol/L (ref 22–32)
Calcium: 9 mg/dL (ref 8.9–10.3)
Chloride: 105 mmol/L (ref 98–111)
Creatinine, Ser: 1.17 mg/dL — ABNORMAL HIGH (ref 0.44–1.00)
GFR, Estimated: 56 mL/min — ABNORMAL LOW (ref >60)
Glucose, Bld: 176 mg/dL — ABNORMAL HIGH (ref 70–99)
Potassium: 5.4 mmol/L — ABNORMAL HIGH (ref 3.5–5.1)
Sodium: 134 mmol/L — ABNORMAL LOW (ref 135–145)

## 2023-10-30 MED ORDER — LACTATED RINGERS IV BOLUS
500.0000 mL | Freq: Once | INTRAVENOUS | Status: AC
  Administered 2023-10-30: 500 mL via INTRAVENOUS

## 2023-10-30 MED ORDER — DEXAMETHASONE SODIUM PHOSPHATE 10 MG/ML IJ SOLN
10.0000 mg | Freq: Once | INTRAMUSCULAR | Status: AC
  Administered 2023-10-30: 10 mg via INTRAMUSCULAR
  Filled 2023-10-30: qty 1

## 2023-10-30 MED ORDER — OXYCODONE HCL 5 MG PO TABS: 5.0000 mg | ORAL_TABLET | Freq: Four times a day (QID) | ORAL | 0 refills | Status: DC | PRN

## 2023-10-30 MED ORDER — OXYCODONE-ACETAMINOPHEN 5-325 MG PO TABS
1.0000 | ORAL_TABLET | Freq: Once | ORAL | Status: AC
  Administered 2023-10-30: 1 via ORAL
  Filled 2023-10-30: qty 1

## 2023-10-30 NOTE — Discharge Instructions (Addendum)
 Your lab work and examination were reassuring this morning.  I have prescribed a short course of pain medication to be taken as directed.  Please follow-up with Dr.Ramos for further pain control recommendations and further evaluation.  If you develop any life-threatening symptoms please return to the emergency department.

## 2023-10-30 NOTE — ED Provider Notes (Signed)
  EMERGENCY DEPARTMENT AT [facility] Provider Note   CSN: [id number] Arrival date & time: 10/30/23  0051     History  Chief Complaint  Patient presents with  . Back Pain    Karla Duran is a 53 y.o. female.  Patient with past medical history significant for lumbar radiculopathy, chronic low back pain, chronic pain syndrome, disorder of lumbosacral vertebral disc presents the emergency department complaining of increased pain on the right sided low back radiating into the right leg.  She states that the pain itself feels similar to previous flares of her underlying back condition but the pain this time feels worse than usual.  She called her pain management physician earlier today who prescribed a prednisone taper starting with 6 tablets.  She took the 6 initial prednisone tablets at approximately 6 PM.  She also took Toradol, 200 ibuprofen, and her gabapentin without relief.  She called the after-hours number at [facility] who recommended she come to the emergency department for evaluation.  She states she does feel the urge to urinate but does not think that she needs to urinate.  She is able to produce urine.  She denies urinary incontinence, fecal incontinence, saddle anesthesia, injury.  3   Back Pain      Home Medications Prior to Admission medications   Medication Sig Start Date End Date Taking? Authorizing Provider  baclofen (LIORESAL) 10 MG tablet Take 10 mg by mouth daily. 01/08/17   [provider]  diphenhydrAMINE (SOMINEX) 25 MG tablet Take 25 mg by mouth at bedtime as needed for allergies or sleep.    [provider]  gabapentin (NEURONTIN) 100 MG capsule Take 100 mg by mouth at bedtime. 03/02/20   [provider]  ibuprofen (ADVIL) 200 MG tablet Take 200 mg by mouth every 6 (six) hours as needed for moderate pain.    [provider]  meclizine (ANTIVERT) 25 MG tablet Take 1 tablet (25 mg total) by mouth  3 (three) times daily as needed for dizziness. Patient not taking: Reported on 01/22/2020 06/13/17   Jaynie Crumble, PA-C  Multiple Vitamin (MULTIVITAMIN WITH MINERALS) TABS tablet Take 1 tablet by mouth daily.    [provider]  oxyCODONE-acetaminophen (PERCOCET/ROXICET) 5-325 MG tablet Take 1-2 tablets by mouth every 6 (six) hours as needed for severe pain. Patient not taking: Reported on 05/08/2020 01/19/20   Terrilee Files, MD  tiZANidine (ZANAFLEX) 4 MG tablet Take 4 mg by mouth in the morning, at noon, and at bedtime.  02/04/17   [provider]  traMADol (ULTRAM) 50 MG tablet Take 50 mg by mouth daily as needed for moderate pain.  02/16/17   [provider]      Allergies    Meloxicam, Celebrex [celecoxib], and Sulfa antibiotics    Review of Systems   Review of Systems  Musculoskeletal:  Positive for back pain.    Physical Exam Updated Vital Signs BP (!) 180/106 (BP Location: Right Arm)   Pulse (!) 116   Temp 98.5 F (36.9 C) (Oral)   Resp 11   SpO2 97%  Physical Exam Vitals and nursing note reviewed.  Constitutional:      General: She is not in acute distress.    Appearance: She is well-developed.  HENT:     Head: Normocephalic and atraumatic.  Eyes:     Conjunctiva/sclera: Conjunctivae normal.  Cardiovascular:     Rate and Rhythm: Normal rate and regular rhythm.  Comments: Patient was initially tachycardic but improved to a normal heart rate Pulmonary:     Effort: Pulmonary effort is normal. No respiratory distress.  Musculoskeletal:        General: No swelling or tenderness. Normal range of motion.     Cervical back: Normal range of motion and neck supple.     Comments: Negative SLR on right side. No increase in pain with passive ROM of right hip  Skin:    General: Skin is warm and dry.     Capillary Refill: Capillary refill takes less than 2 seconds.  Neurological:     General: No focal deficit present.     Mental Status: She  is alert.  Psychiatric:        Mood and Affect: Mood normal.    ED Results / Procedures / Treatments   Labs (all labs ordered are listed, but only abnormal results are displayed) Labs Reviewed  BASIC METABOLIC PANEL WITH GFR  CBC WITH DIFFERENTIAL/PLATELET  URINALYSIS, ROUTINE W REFLEX MICROSCOPIC    EKG None  Radiology No results found.  Procedures Procedures    Medications Ordered in ED Medications  dexamethasone (DECADRON) injection 10 mg (has no administration in time range)  oxyCODONE-acetaminophen (PERCOCET/ROXICET) 5-325 MG per tablet 1 tablet (has no administration in time range)    ED Course/ Medical Decision Making/ A&P                                 Medical Decision Making Amount and/or Complexity of Data Reviewed Labs: ordered.  Risk Prescription drug management.   This patient presents to the ED for concern of back pain, this involves an extensive number of treatment options, and is a complaint that carries with it a high risk of complications and morbidity.  The differential diagnosis includes sciatica, fracture, dislocation, soft tissue injury, herniated disc, cauda equina, others   Co morbidities that complicate the patient evaluation  History of chronic pain/chronic back pain, lumbar postlaminectomy syndrome   Lab Tests:  I Ordered, and personally interpreted labs.  The pertinent results include: UA with trace leukocytes, rare bacteria; potassium mildly elevated at 5.4; hemoglobin 8.5 (patient with history of microcytic anemia, hemoglobin improved from previous results from some years ago)   Imaging Studies ordered:  No red flag symptoms to indicate need for emergent spinal imaging at this time   Cardiac Monitoring: / EKG:  The patient was maintained on a cardiac monitor.  I personally viewed and interpreted the cardiac monitored which showed an underlying rhythm of: Sinus tachycardia   Problem List / ED Course / Critical  interventions / Medication management   I ordered medication including Percocet and Decadron for pain, LR bolus for resuscitation Reevaluation of the patient after these medicines showed that the patient improved I have reviewed the patients home medicines and have made adjustments as needed   Test / Admission - Considered:  Patient with low back pain with symptoms radiating down the right leg.  Patient with history of the same but pain was worse this evening.  Patient's chief concern was possibility of cauda equina if she felt the need to possibly urinate earlier but did not have to urinate.  Patient had negligible amount of urine in bladder per bladder scan upon arrival.  After fluids patient was able to urinate and provide a sample without difficulty.  Postvoid residual less than 10 mL.  No other red flag symptoms  to suggest cauda equina.  No indication for MRI this evening.  Patient reassured.  Patient does feel moderately better after medications here in the emergency department.  Plan to discharge home with short course of pain medication and recommendations for continued follow-up with physiatry.  Return precautions provided.          Final Clinical Impression(s) / ED Diagnoses Final diagnoses:  None    Rx / DC Orders ED Discharge Orders     None         Pamala Duffel 10/30/23 0504    Nira Conn, MD 10/30/23 [phone]

## 2023-10-30 NOTE — ED Triage Notes (Signed)
 Pt report pain to right lower back pain that started Wednesday night. Pain radiates down right leg and sensation to urinate. No urinary incontinence. No known injury or trauma. Reports chronic back pain, however, this pain feels different. Has taken steroids, ibuprofen, gabapentin, and tramadol without relief.
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
## 2024-03-09 ENCOUNTER — Ambulatory Visit: Attending: Family Medicine

## 2024-03-09 ENCOUNTER — Other Ambulatory Visit: Payer: Self-pay

## 2024-03-09 DIAGNOSIS — M5459 Other low back pain: Secondary | ICD-10-CM | POA: Insufficient documentation

## 2024-03-09 DIAGNOSIS — M5416 Radiculopathy, lumbar region: Secondary | ICD-10-CM | POA: Insufficient documentation

## 2024-03-09 DIAGNOSIS — M6281 Muscle weakness (generalized): Secondary | ICD-10-CM | POA: Diagnosis present

## 2024-03-20 ENCOUNTER — Encounter: Payer: Self-pay | Admitting: Emergency Medicine

## 2024-03-20 ENCOUNTER — Ambulatory Visit: Admission: EM | Admit: 2024-03-20 | Discharge: 2024-03-20 | Disposition: A | Discharge status: Home / Self Care

## 2024-03-20 DIAGNOSIS — G8929 Other chronic pain: Secondary | ICD-10-CM | POA: Diagnosis not present

## 2024-03-20 DIAGNOSIS — M5441 Lumbago with sciatica, right side: Secondary | ICD-10-CM

## 2024-03-20 MED ORDER — PREDNISONE 20 MG PO TABS: 40.0000 mg | ORAL_TABLET | Freq: Every day | ORAL | 0 refills | Status: AC

## 2024-03-20 MED ORDER — BACLOFEN 10 MG PO TABS: 10.0000 mg | ORAL_TABLET | Freq: Three times a day (TID) | ORAL | 0 refills | Status: AC

## 2024-03-20 MED ORDER — METHYLPREDNISOLONE SODIUM SUCC 125 MG IJ SOLR
80.0000 mg | Freq: Once | INTRAMUSCULAR | Status: AC
  Administered 2024-03-20: 80 mg via INTRAMUSCULAR

## 2024-03-20 NOTE — Discharge Instructions (Signed)
 Your back pain is caused by sciatic nerve inflammation.   We gave you a shot of steroid in the clinic today to reduce inflammation.   Take prednisone  steroid pills as prescribed starting tomorrow.  2 pills- 40mg - once daily each morning with food/breakfast for 5 days Do not take ibuprofen/naproxen/other NSAIDs while taking steroid pills as this can cause upset stomach.   Take muscle relaxer as needed for muscle spasm, mostly take this at bedtime as this medicine can cause drowsiness.  Apply heat to the pulled muscle 20 minutes on 20 minutes off as needed, heat relaxes muscles.  Perform gentle exercises and stretches to area of tenderness.  I would like for you to rest, however I do not want you to avoid moving the area. Movement and stretching will help with healing.  Red flag symptoms to watch out for are numbness/tingling to the legs, weakness, loss of bowel/bladder control, and/or worsening pain that does not respond well to medicines.  Follow-up with your primary care provider or return to urgent care if your symptoms do not improve in the next 3 to 4 days with medications and interventions recommended today. If your symptoms are severe (red flag), please go to the emergency room.

## 2024-03-20 NOTE — ED Triage Notes (Signed)
 Pt c/o chronic lower back pain on right side that has gotten progressively worse since Thursday. Pt is suppose to start PT on Wednesday. She tried to contact [facility] but no one answer.   Pain worse on right side and is now radiating down to calf and tingling in big toe.   She has been using tizanidine , tramadol, and gabapentin and got recent prescription for robaxin  which have not helped.
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
## 2024-03-23 ENCOUNTER — Ambulatory Visit: Attending: Family Medicine | Admitting: Physical Therapy

## 2024-03-23 ENCOUNTER — Encounter: Payer: Self-pay | Admitting: Physical Therapy

## 2024-03-23 ENCOUNTER — Emergency Department (HOSPITAL_COMMUNITY)
Admission: EM | Admit: 2024-03-23 | Discharge: 2024-03-24 | Disposition: A | Discharge status: Home / Self Care | Attending: Emergency Medicine | Admitting: Emergency Medicine

## 2024-03-23 DIAGNOSIS — M5459 Other low back pain: Secondary | ICD-10-CM | POA: Diagnosis present

## 2024-03-23 DIAGNOSIS — M6281 Muscle weakness (generalized): Secondary | ICD-10-CM | POA: Diagnosis present

## 2024-03-23 DIAGNOSIS — M545 Low back pain, unspecified: Secondary | ICD-10-CM | POA: Diagnosis present

## 2024-03-23 DIAGNOSIS — M5416 Radiculopathy, lumbar region: Secondary | ICD-10-CM | POA: Insufficient documentation

## 2024-03-23 MED ORDER — OXYCODONE-ACETAMINOPHEN 5-325 MG PO TABS
1.0000 | ORAL_TABLET | Freq: Once | ORAL | Status: AC
  Administered 2024-03-24: 1 via ORAL
  Filled 2024-03-23: qty 1

## 2024-03-23 MED ORDER — METHYLPREDNISOLONE ACETATE 40 MG/ML IJ SUSP
40.0000 mg | Freq: Once | INTRAMUSCULAR | Status: AC
  Administered 2024-03-24: 40 mg via INTRAMUSCULAR
  Filled 2024-03-23: qty 1

## 2024-03-23 NOTE — Discharge Instructions (Addendum)
 Take Percocet in place of your tramadol.  Do not drive or operate machinery while taking Percocet. Call your orthopedist office in the morning to schedule follow-up.  Also suggest contacting neurosurgery to discuss your case. Return to ER for severe or concerning symptoms.  Monitor your blood pressure.  Please record results and follow-up with your primary care provider for recheck.

## 2024-03-23 NOTE — ED Provider Notes (Signed)
 [place] [facility] AT [facility] Provider Note   CSN: [id number] Arrival date & time: 03/23/24  2243     Patient presents with: No chief complaint on file.   Karla Duran is a 53 y.o. female.  {Add pertinent medical, surgical, social history, OB history to HPI:310} 53 year old female with complaint of right lower back pain onset April 2025. Patient has followed up with Dr. Burnetta, had MRI showing herniated disc at L4-L5 and has recently started aqua therapy. She is taking Baclofen , Robaxin , prednisone  without any improvement in her pain. Pain radiates down right leg to her calf with numbness in her right great toe and pad of her foot. She is frustrated that pain/symptoms are progressing despite doing everything she has been told to do. No fevers, no recent injury, no saddle paresthesia or loss of bowel or bladder control reported. Prior L5/S1 disc replacement. No other complaints or concerns.        Prior to Admission medications   Medication Sig Start Date End Date Taking? Authorizing Provider  baclofen  (LIORESAL ) 10 MG tablet Take 1 tablet (10 mg total) by mouth 3 (three) times daily. 03/20/24   Enedelia Dorna HERO, FNP  diphenhydrAMINE (BENADRYL) 25 MG tablet Take 25 mg by mouth every 6 (six) hours as needed for allergies.    [provider]  gabapentin (NEURONTIN) 100 MG capsule Take 100 mg by mouth at bedtime. 03/02/20   [provider]  ibuprofen  (ADVIL ) 200 MG tablet Take 200-600 mg by mouth every 6 (six) hours as needed for moderate pain (pain score 4-6).    [provider]  loratadine (CLARITIN) 10 MG tablet Take 10 mg by mouth daily as needed for allergies. Patient not taking: Reported on 03/20/2024    [provider]  oxyCODONE  (ROXICODONE ) 5 MG immediate release tablet Take 1 tablet (5 mg total) by mouth every 6 (six) hours as needed for severe pain (pain score 7-10). 10/30/23   Logan Ubaldo NOVAK, PA-C   predniSONE  (DELTASONE ) 20 MG tablet Take 2 tablets (40 mg total) by mouth daily with breakfast for 5 days. 03/20/24 03/25/24  Enedelia Dorna HERO, FNP  traMADol (ULTRAM) 50 MG tablet Take 50 mg by mouth daily as needed for moderate pain.  02/16/17   [provider]    Allergies: Meloxicam, Celebrex [celecoxib], and Sulfa antibiotics    Review of Systems Negative except as per HPI Updated Vital Signs BP (!) 206/125   Pulse 97   Temp 98 F (36.7 C) (Oral)   Resp 18   LMP 02/05/2016 (Approximate)   SpO2 98%   Physical Exam Vitals and nursing note reviewed.  Constitutional:      General: She is not in acute distress.    Appearance: She is well-developed. She is not diaphoretic.  HENT:     Head: Normocephalic and atraumatic.  Cardiovascular:     Pulses: Normal pulses.  Pulmonary:     Effort: Pulmonary effort is normal.  Abdominal:     Palpations: Abdomen is soft.     Tenderness: There is no abdominal tenderness.  Musculoskeletal:        General: No swelling, tenderness or deformity.     Thoracic back: No tenderness or bony tenderness.     Lumbar back: No tenderness or bony tenderness. Positive right straight leg raise test and positive left straight leg raise test.  Skin:    General: Skin is warm and dry.  Neurological:     Mental  Status: She is alert and oriented to person, place, and time.     Sensory: No sensory deficit.     Motor: No weakness.     Deep Tendon Reflexes: Reflexes normal.  Psychiatric:        Behavior: Behavior normal.     (all labs ordered are listed, but only abnormal results are displayed) Labs Reviewed  I-STAT CHEM 8, ED    EKG: None  Radiology: No results found.  {Document cardiac monitor, telemetry assessment procedure when appropriate:32947} Procedures   Medications Ordered in the ED  methylPREDNISolone  acetate (DEPO-MEDROL ) injection 40 mg (has no administration in time range)  oxyCODONE -acetaminophen  (PERCOCET/ROXICET)  5-325 MG per tablet 1 tablet (has no administration in time range)      {Click here for ABCD2, HEART and other calculators REFRESH Note before signing:1}                              Medical Decision Making Risk Prescription drug management.   ***  {Document critical care time when appropriate  Document review of labs and clinical decision tools ie CHADS2VASC2, etc  Document your independent review of radiology images and any outside records  Document your discussion with family members, caretakers and with consultants  Document social determinants of health affecting pt's care  Document your decision making why or why not admission, treatments were needed:32947:::1}   Final diagnoses:  None    ED Discharge Orders     None

## 2024-03-23 NOTE — ED Triage Notes (Signed)
 Patient states she does not have a history of high blood pressure and takes no meds for her bp. States her pain is causing her bp to be elevated.

## 2024-03-23 NOTE — ED Triage Notes (Signed)
 Patient complains of pain in her lower back. Started one week ago and states it has gotten worse. Patient has been in water therapy and not getting any relief. Patient states pain is radiating down her right leg causing numbness in her foot. Has been having spasms and can not get comfortable. Rates pain 9/10.
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
## 2024-03-24 LAB — I-STAT CHEM 8, ED
BUN: 14 mg/dL (ref 6–20)
Calcium, Ion: 1.12 mmol/L — ABNORMAL LOW (ref 1.15–1.40)
Chloride: 111 mmol/L (ref 98–111)
Creatinine, Ser: 0.7 mg/dL (ref 0.44–1.00)
Glucose, Bld: 88 mg/dL (ref 70–99)
HCT: 30 % — ABNORMAL LOW (ref 36.0–46.0)
Hemoglobin: 10.2 g/dL — ABNORMAL LOW (ref 12.0–15.0)
Potassium: 4 mmol/L (ref 3.5–5.1)
Sodium: 142 mmol/L (ref 135–145)
TCO2: 23 mmol/L (ref 22–32)

## 2024-03-24 MED ORDER — OXYCODONE-ACETAMINOPHEN 5-325 MG PO TABS: 1.0000 | ORAL_TABLET | Freq: Four times a day (QID) | ORAL | 0 refills | Status: DC | PRN

## 2024-03-24 NOTE — ED Notes (Signed)
 Patient dc with home care instruction. IV discontinued.

## 2024-03-30 ENCOUNTER — Ambulatory Visit: Admitting: Physical Therapy

## 2024-03-31 ENCOUNTER — Ambulatory Visit

## 2024-04-05 ENCOUNTER — Other Ambulatory Visit: Payer: Self-pay | Admitting: Orthopedic Surgery

## 2024-04-05 DIAGNOSIS — M545 Low back pain, unspecified: Secondary | ICD-10-CM

## 2024-04-07 ENCOUNTER — Ambulatory Visit

## 2024-04-15 NOTE — Discharge Instructions (Signed)

## 2024-04-18 ENCOUNTER — Ambulatory Visit
Admission: RE | Admit: 2024-04-18 | Discharge: 2024-04-18 | Disposition: A | Discharge status: Home / Self Care | Source: Ambulatory Visit | Attending: Orthopedic Surgery | Admitting: Orthopedic Surgery

## 2024-04-18 DIAGNOSIS — M545 Low back pain, unspecified: Secondary | ICD-10-CM

## 2024-04-18 MED ORDER — IOPAMIDOL (ISOVUE-M 200) INJECTION 41%
18.0000 mL | Freq: Once | INTRAMUSCULAR | Status: AC
Start: 2024-04-18 — End: 2024-04-18
  Administered 2024-04-18: 18 mL via INTRATHECAL

## 2024-04-18 MED ORDER — MEPERIDINE HCL 50 MG/ML IJ SOLN: 50.0000 mg | Freq: Once | INTRAMUSCULAR | Status: DC | PRN

## 2024-04-18 MED ORDER — DIAZEPAM 5 MG PO TABS
10.0000 mg | ORAL_TABLET | Freq: Once | ORAL | Status: AC
  Administered 2024-04-18: 10 mg via ORAL

## 2024-04-18 MED ORDER — ONDANSETRON HCL 4 MG/2ML IJ SOLN: 4.0000 mg | Freq: Once | INTRAMUSCULAR | Status: DC | PRN
